# Patient Record
Sex: Male | Born: 1975 | Race: Black or African American | Hispanic: No | Marital: Single | Smoking: Current every day smoker
Health system: Southern US, Community
[De-identification: ages and names within clinical notes are randomized; demographics above are authoritative.]

## PROBLEM LIST (undated history)

## (undated) DIAGNOSIS — M1712 Unilateral primary osteoarthritis, left knee: Secondary | ICD-10-CM

## (undated) DIAGNOSIS — F419 Anxiety disorder, unspecified: Secondary | ICD-10-CM

## (undated) DIAGNOSIS — Z21 Asymptomatic human immunodeficiency virus [HIV] infection status: Secondary | ICD-10-CM

## (undated) DIAGNOSIS — F209 Schizophrenia, unspecified: Secondary | ICD-10-CM

## (undated) DIAGNOSIS — I10 Essential (primary) hypertension: Secondary | ICD-10-CM

## (undated) DIAGNOSIS — M545 Low back pain, unspecified: Secondary | ICD-10-CM

## (undated) DIAGNOSIS — I639 Cerebral infarction, unspecified: Secondary | ICD-10-CM

## (undated) DIAGNOSIS — F32A Depression, unspecified: Secondary | ICD-10-CM

## (undated) DIAGNOSIS — B20 Human immunodeficiency virus [HIV] disease: Secondary | ICD-10-CM

## (undated) DIAGNOSIS — M87052 Idiopathic aseptic necrosis of left femur: Secondary | ICD-10-CM

## (undated) DIAGNOSIS — F329 Major depressive disorder, single episode, unspecified: Secondary | ICD-10-CM

## (undated) DIAGNOSIS — F4310 Post-traumatic stress disorder, unspecified: Secondary | ICD-10-CM

## (undated) DIAGNOSIS — F319 Bipolar disorder, unspecified: Secondary | ICD-10-CM

## (undated) DIAGNOSIS — W3400XA Accidental discharge from unspecified firearms or gun, initial encounter: Secondary | ICD-10-CM

## (undated) DIAGNOSIS — B1920 Unspecified viral hepatitis C without hepatic coma: Secondary | ICD-10-CM

## (undated) DIAGNOSIS — F22 Delusional disorders: Secondary | ICD-10-CM

## (undated) DIAGNOSIS — G8929 Other chronic pain: Secondary | ICD-10-CM

## (undated) HISTORY — DX: Post-traumatic stress disorder, unspecified: F43.10

## (undated) HISTORY — PX: BACK SURGERY: SHX140

## (undated) HISTORY — DX: Cerebral infarction, unspecified: I63.9

## (undated) HISTORY — DX: Unspecified viral hepatitis C without hepatic coma: B19.20

---
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
## 2006-06-17 DIAGNOSIS — W3400XA Accidental discharge from unspecified firearms or gun, initial encounter: Secondary | ICD-10-CM

## 2006-06-17 HISTORY — PX: MOUTH SURGERY: SHX715

## 2006-06-17 HISTORY — DX: Accidental discharge from unspecified firearms or gun, initial encounter: W34.00XA

## 2011-12-02 DIAGNOSIS — M542 Cervicalgia: Secondary | ICD-10-CM | POA: Insufficient documentation

## 2011-12-02 DIAGNOSIS — G8929 Other chronic pain: Secondary | ICD-10-CM | POA: Insufficient documentation

## 2011-12-16 DIAGNOSIS — Z22322 Carrier or suspected carrier of Methicillin resistant Staphylococcus aureus: Secondary | ICD-10-CM | POA: Insufficient documentation

## 2011-12-30 DIAGNOSIS — F39 Unspecified mood [affective] disorder: Secondary | ICD-10-CM | POA: Insufficient documentation

## 2012-06-17 HISTORY — PX: SPINAL FUSION: SHX223

## 2013-05-28 DIAGNOSIS — M8700 Idiopathic aseptic necrosis of unspecified bone: Secondary | ICD-10-CM | POA: Insufficient documentation

## 2014-01-17 MED ORDER — IBUPROFEN 800 MG TAB
800 mg | ORAL_TABLET | Freq: Four times a day (QID) | ORAL | Status: DC | PRN
Start: 2014-01-17 — End: 2014-01-17

## 2014-01-17 MED ORDER — IBUPROFEN 800 MG TAB
800 mg | ORAL_TABLET | Freq: Four times a day (QID) | ORAL | Status: AC | PRN
Start: 2014-01-17 — End: 2014-01-24

## 2014-01-17 MED ORDER — HYDROCODONE-ACETAMINOPHEN 5 MG-325 MG TAB
5-325 mg | ORAL | Status: AC
Start: 2014-01-17 — End: 2014-01-17
  Administered 2014-01-17: 20:00:00 via ORAL

## 2014-01-17 MED FILL — HYDROCODONE-ACETAMINOPHEN 5 MG-325 MG TAB: 5-325 mg | ORAL | Qty: 1

## 2014-01-17 NOTE — ED Notes (Signed)
Pt to xray with xray tech.

## 2014-01-17 NOTE — ED Notes (Signed)
Pt medicated as ordered for pain. Liborio Nixon, tech to apply splint.
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
## 2014-01-17 NOTE — Other (Addendum)
I have reviewed discharge instructions with the patient.  The patient verbalized understanding of taking prescription for further pain and F/U with orthopedist. Pt declined discharge vital signs stating he needed to go.
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
## 2014-01-28 DIAGNOSIS — M79671 Pain in right foot: Secondary | ICD-10-CM

## 2014-01-28 DIAGNOSIS — G8929 Other chronic pain: Secondary | ICD-10-CM | POA: Insufficient documentation

## 2014-06-17 DIAGNOSIS — I639 Cerebral infarction, unspecified: Secondary | ICD-10-CM

## 2014-06-17 HISTORY — DX: Cerebral infarction, unspecified: I63.9

## 2014-06-17 HISTORY — PX: BRAIN SURGERY: SHX531

## 2015-09-14 DIAGNOSIS — K7460 Unspecified cirrhosis of liver: Secondary | ICD-10-CM | POA: Insufficient documentation

## 2016-03-14 ENCOUNTER — Emergency Department (HOSPITAL_COMMUNITY)
Admission: EM | Admit: 2016-03-14 | Discharge: 2016-03-14 | Disposition: A | Discharge status: Home / Self Care | Payer: Medicaid - Out of State | Attending: Dermatology | Admitting: Dermatology

## 2016-03-14 ENCOUNTER — Encounter (HOSPITAL_COMMUNITY): Payer: Self-pay | Admitting: Emergency Medicine

## 2016-03-14 DIAGNOSIS — Z760 Encounter for issue of repeat prescription: Secondary | ICD-10-CM | POA: Diagnosis not present

## 2016-03-14 DIAGNOSIS — Z5321 Procedure and treatment not carried out due to patient leaving prior to being seen by health care provider: Secondary | ICD-10-CM | POA: Diagnosis not present

## 2016-03-14 DIAGNOSIS — I10 Essential (primary) hypertension: Secondary | ICD-10-CM | POA: Insufficient documentation

## 2016-03-14 DIAGNOSIS — F17210 Nicotine dependence, cigarettes, uncomplicated: Secondary | ICD-10-CM | POA: Diagnosis not present

## 2016-03-14 HISTORY — DX: Essential (primary) hypertension: I10

## 2016-03-14 HISTORY — DX: Delusional disorders: F22

## 2016-03-14 HISTORY — DX: Asymptomatic human immunodeficiency virus (hiv) infection status: Z21

## 2016-03-14 HISTORY — DX: Human immunodeficiency virus (HIV) disease: B20

## 2016-03-14 HISTORY — DX: Anxiety disorder, unspecified: F41.9

## 2016-03-14 HISTORY — DX: Accidental discharge from unspecified firearms or gun, initial encounter: W34.00XA

## 2016-03-14 NOTE — ED Triage Notes (Signed)
Pt states just moved here from [place]. Takes methodone for narcotic withdrawls. Pt states he missed intake appt. Pt asking for methadone. States last dose yesterday. Denies SI/HI.

## 2016-03-14 NOTE — ED Notes (Signed)
Pt reports he just needs med refill and got to intake too late this morning for methadone program. His ride is currently here and he wishes to leave. He will return for intake tomm.

## 2016-03-25 DIAGNOSIS — F3181 Bipolar II disorder: Secondary | ICD-10-CM | POA: Insufficient documentation

## 2016-04-27 ENCOUNTER — Emergency Department (HOSPITAL_COMMUNITY)
Admission: EM | Admit: 2016-04-27 | Discharge: 2016-04-27 | Discharge status: Against Medical Advice | Payer: Medicaid - Out of State | Attending: Dermatology | Admitting: Dermatology

## 2016-04-27 ENCOUNTER — Encounter (HOSPITAL_COMMUNITY): Payer: Self-pay | Admitting: Nurse Practitioner

## 2016-04-27 DIAGNOSIS — I10 Essential (primary) hypertension: Secondary | ICD-10-CM | POA: Diagnosis not present

## 2016-04-27 DIAGNOSIS — F17210 Nicotine dependence, cigarettes, uncomplicated: Secondary | ICD-10-CM | POA: Diagnosis not present

## 2016-04-27 DIAGNOSIS — Z5321 Procedure and treatment not carried out due to patient leaving prior to being seen by health care provider: Secondary | ICD-10-CM | POA: Insufficient documentation

## 2016-04-27 DIAGNOSIS — R21 Rash and other nonspecific skin eruption: Secondary | ICD-10-CM | POA: Insufficient documentation

## 2016-04-27 NOTE — ED Triage Notes (Addendum)
Pt presents with c/o rash. The rash began 2 weeks ago. The rash is itchy, scaly, and red to entire body. He has tried hydrocortisone, triamcinolone, gold bond with no relief. The rash is worse at night. He has multiple reddened, scaly, dried patchy areas to trunk, arms, legs.

## 2016-04-27 NOTE — ED Notes (Addendum)
Pt in hallway speaking loudly at nursing staff to get doctor in room. Pt asked to please step into his room and I would inform MD. Pt continued cursing and did go back into room. [employer] at bedside to speak with pt. In arrival on [employer] pt states he is leaving ,MD beaton made aware pt left without signing any AMA forms.

## 2016-04-28 ENCOUNTER — Encounter (HOSPITAL_COMMUNITY): Payer: Self-pay

## 2016-04-28 ENCOUNTER — Emergency Department (HOSPITAL_COMMUNITY)
Admission: EM | Admit: 2016-04-28 | Discharge: 2016-04-28 | Disposition: A | Discharge status: Home / Self Care | Payer: Medicaid - Out of State | Attending: Emergency Medicine | Admitting: Emergency Medicine

## 2016-04-28 DIAGNOSIS — Z79899 Other long term (current) drug therapy: Secondary | ICD-10-CM | POA: Insufficient documentation

## 2016-04-28 DIAGNOSIS — B86 Scabies: Secondary | ICD-10-CM | POA: Insufficient documentation

## 2016-04-28 DIAGNOSIS — I10 Essential (primary) hypertension: Secondary | ICD-10-CM | POA: Insufficient documentation

## 2016-04-28 DIAGNOSIS — F17210 Nicotine dependence, cigarettes, uncomplicated: Secondary | ICD-10-CM | POA: Insufficient documentation

## 2016-04-28 MED ORDER — PREDNISONE 20 MG PO TABS: 60.0000 mg | ORAL_TABLET | Freq: Once | ORAL | 0 refills | Status: AC

## 2016-04-28 MED ORDER — PERMETHRIN 5 % EX CREA: TOPICAL_CREAM | CUTANEOUS | 1 refills | Status: DC

## 2016-04-28 NOTE — ED Notes (Signed)
Pt has raised, scaly rash on multiple parts of body.  Extreme itching. Denies SOB and swelling.   Patient states that he has been around neighbors that had been diagnosed with scabies.  Patient states that it is a large possibility that it is scabies.
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
## 2016-04-28 NOTE — ED Notes (Signed)
Unable to draw blood from patient, many burrows from scabies over veins that are scarred from IV drug use- pt states he hill return to have klabs drawn as needed when itching and scabies go away. Labs cancelled per dr pickering.

## 2016-04-28 NOTE — Discharge Instructions (Signed)
Apply the cream. It can be washed off in 8-14 hours later. Apply a second dose in 2 weeks.

## 2016-04-28 NOTE — ED Triage Notes (Signed)
Patient complains of severe itching and rash. The rash is scaly and open on feet. In ED yesterday but left prior to being seen. Has tried meds otc and prescription without relief

## 2016-05-14 ENCOUNTER — Emergency Department (HOSPITAL_COMMUNITY)
Admission: EM | Admit: 2016-05-14 | Discharge: 2016-05-14 | Disposition: A | Discharge status: Home / Self Care | Payer: Medicaid - Out of State | Attending: Emergency Medicine | Admitting: Emergency Medicine

## 2016-05-14 ENCOUNTER — Encounter (HOSPITAL_COMMUNITY): Payer: Self-pay | Admitting: *Deleted

## 2016-05-14 DIAGNOSIS — F17210 Nicotine dependence, cigarettes, uncomplicated: Secondary | ICD-10-CM | POA: Diagnosis not present

## 2016-05-14 DIAGNOSIS — I10 Essential (primary) hypertension: Secondary | ICD-10-CM | POA: Insufficient documentation

## 2016-05-14 DIAGNOSIS — R21 Rash and other nonspecific skin eruption: Secondary | ICD-10-CM | POA: Diagnosis present

## 2016-05-14 DIAGNOSIS — Z79899 Other long term (current) drug therapy: Secondary | ICD-10-CM | POA: Insufficient documentation

## 2016-05-14 MED ORDER — PERMETHRIN 5 % EX CREA: TOPICAL_CREAM | CUTANEOUS | 1 refills | Status: DC

## 2016-05-14 MED ORDER — PREDNISONE 10 MG PO TABS: ORAL_TABLET | ORAL | 0 refills | Status: DC

## 2016-05-14 MED FILL — PERMETHRIN 5% CREAM: 5 | 30 days supply | Qty: 120 | Fill #0

## 2016-05-14 MED FILL — predniSONE 10 MG TABS: 10 | 7 days supply | Qty: 21 | Fill #0

## 2016-05-14 NOTE — Discharge Planning (Signed)
[facility] consulted to regarding medication assistance.  Pt has out of [place] Medicaid and aware of steps to apply for [place] Medicare.  Pt advised to fill Rx at [facility] today and to visit DSS to apply for [place] Medicaid.  Pt verbalizes understanding.

## 2016-05-14 NOTE — ED Notes (Signed)
Care management in.

## 2016-05-14 NOTE — ED Notes (Signed)
Waiting for Care Management.

## 2016-05-14 NOTE — ED Triage Notes (Signed)
Pt reports being diagnosed with scabies on last visit, no relief with cream. Still has rash and itching to arms, torso and now neck.
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
## 2016-06-04 MED FILL — PERMETHRIN 5% CREAM: 5 | 30 days supply | Qty: 120 | Fill #1

## 2016-06-19 ENCOUNTER — Encounter (HOSPITAL_COMMUNITY): Payer: Self-pay | Admitting: Emergency Medicine

## 2016-06-19 ENCOUNTER — Emergency Department (HOSPITAL_COMMUNITY)
Admission: EM | Admit: 2016-06-19 | Discharge: 2016-06-19 | Disposition: A | Discharge status: Home / Self Care | Payer: Medicaid - Out of State | Attending: Physician Assistant | Admitting: Physician Assistant

## 2016-06-19 DIAGNOSIS — I10 Essential (primary) hypertension: Secondary | ICD-10-CM | POA: Diagnosis not present

## 2016-06-19 DIAGNOSIS — F17210 Nicotine dependence, cigarettes, uncomplicated: Secondary | ICD-10-CM | POA: Diagnosis not present

## 2016-06-19 DIAGNOSIS — L309 Dermatitis, unspecified: Secondary | ICD-10-CM | POA: Diagnosis not present

## 2016-06-19 DIAGNOSIS — R21 Rash and other nonspecific skin eruption: Secondary | ICD-10-CM | POA: Diagnosis present

## 2016-06-19 MED ORDER — NYSTATIN-TRIAMCINOLONE 100000-0.1 UNIT/GM-% EX CREA: TOPICAL_CREAM | CUTANEOUS | 0 refills | Status: DC

## 2016-06-19 MED ORDER — PREDNISONE 50 MG PO TABS: ORAL_TABLET | ORAL | 0 refills | Status: DC

## 2016-06-19 MED ORDER — PREDNISONE 20 MG PO TABS
60.0000 mg | ORAL_TABLET | Freq: Once | ORAL | Status: AC
  Administered 2016-06-19: 60 mg via ORAL
  Filled 2016-06-19: qty 3

## 2016-06-19 NOTE — Discharge Instructions (Signed)
Call Dr. Juel Burrow office for a follow up appointment.

## 2016-06-19 NOTE — ED Triage Notes (Signed)
Pt presents to ED for a "re-occurrence" of scabies.  Pt recently treated and moved out of hotel he was staying in.  States they washed everything in bleach.  Rash with scarring noted to bilateral arms, pt c/o itching to legs, torso and back as well.

## 2016-06-19 NOTE — ED Notes (Signed)
..  Video visit coupon no. [id number] mced  and handout given to pt. on discharge .
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
## 2016-09-02 ENCOUNTER — Ambulatory Visit (INDEPENDENT_AMBULATORY_CARE_PROVIDER_SITE_OTHER): Payer: Medicaid Other | Admitting: Physician Assistant

## 2016-09-02 ENCOUNTER — Encounter (INDEPENDENT_AMBULATORY_CARE_PROVIDER_SITE_OTHER): Payer: Self-pay | Admitting: Physician Assistant

## 2016-09-02 VITALS — BP 162/102 | HR 96 | Temp 97.8°F | Ht 69.5 in | Wt 165.2 lb

## 2016-09-02 DIAGNOSIS — K759 Inflammatory liver disease, unspecified: Secondary | ICD-10-CM

## 2016-09-02 DIAGNOSIS — M25562 Pain in left knee: Secondary | ICD-10-CM

## 2016-09-02 DIAGNOSIS — F209 Schizophrenia, unspecified: Secondary | ICD-10-CM | POA: Diagnosis not present

## 2016-09-02 DIAGNOSIS — F4310 Post-traumatic stress disorder, unspecified: Secondary | ICD-10-CM | POA: Diagnosis not present

## 2016-09-02 DIAGNOSIS — M25561 Pain in right knee: Secondary | ICD-10-CM

## 2016-09-02 DIAGNOSIS — F419 Anxiety disorder, unspecified: Secondary | ICD-10-CM

## 2016-09-02 DIAGNOSIS — M25462 Effusion, left knee: Secondary | ICD-10-CM | POA: Insufficient documentation

## 2016-09-02 DIAGNOSIS — I10 Essential (primary) hypertension: Secondary | ICD-10-CM | POA: Insufficient documentation

## 2016-09-02 DIAGNOSIS — G8929 Other chronic pain: Secondary | ICD-10-CM

## 2016-09-02 DIAGNOSIS — B20 Human immunodeficiency virus [HIV] disease: Secondary | ICD-10-CM | POA: Insufficient documentation

## 2016-09-02 DIAGNOSIS — F200 Paranoid schizophrenia: Secondary | ICD-10-CM | POA: Insufficient documentation

## 2016-09-02 DIAGNOSIS — K739 Chronic hepatitis, unspecified: Secondary | ICD-10-CM | POA: Insufficient documentation

## 2016-09-02 DIAGNOSIS — L209 Atopic dermatitis, unspecified: Secondary | ICD-10-CM

## 2016-09-02 MED ORDER — LISINOPRIL 40 MG PO TABS: 40.0000 mg | ORAL_TABLET | Freq: Every day | ORAL | 11 refills | Status: DC

## 2016-09-02 MED ORDER — CITALOPRAM HYDROBROMIDE 10 MG PO TABS: 20.0000 mg | ORAL_TABLET | Freq: Every day | ORAL | 2 refills | Status: DC

## 2016-09-02 MED ORDER — CHLORTHALIDONE 50 MG PO TABS: 50.0000 mg | ORAL_TABLET | Freq: Every day | ORAL | 11 refills | Status: DC

## 2016-09-02 MED ORDER — ELVITEG-COBIC-EMTRICIT-TENOFAF 150-150-200-10 MG PO TABS: 1.0000 | ORAL_TABLET | Freq: Every day | ORAL | 2 refills | Status: DC

## 2016-09-02 MED ORDER — GABAPENTIN 300 MG PO CAPS: 300.0000 mg | ORAL_CAPSULE | Freq: Three times a day (TID) | ORAL | 2 refills | Status: DC

## 2016-09-02 MED ORDER — TRIAMCINOLONE ACETONIDE 0.1 % EX OINT: 1.0000 "application " | TOPICAL_OINTMENT | Freq: Two times a day (BID) | CUTANEOUS | 0 refills | Status: DC

## 2016-09-02 MED ORDER — QUETIAPINE FUMARATE 50 MG PO TABS: 50.0000 mg | ORAL_TABLET | Freq: Every day | ORAL | 2 refills | Status: DC
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
## 2016-09-02 NOTE — Patient Instructions (Addendum)

## 2016-09-27 ENCOUNTER — Ambulatory Visit (INDEPENDENT_AMBULATORY_CARE_PROVIDER_SITE_OTHER): Payer: Medicaid Other | Admitting: Physician Assistant

## 2016-12-09 DIAGNOSIS — B1920 Unspecified viral hepatitis C without hepatic coma: Secondary | ICD-10-CM | POA: Insufficient documentation

## 2016-12-09 DIAGNOSIS — Z79891 Long term (current) use of opiate analgesic: Secondary | ICD-10-CM | POA: Insufficient documentation

## 2016-12-09 DIAGNOSIS — F1190 Opioid use, unspecified, uncomplicated: Secondary | ICD-10-CM | POA: Insufficient documentation

## 2016-12-09 DIAGNOSIS — F1120 Opioid dependence, uncomplicated: Secondary | ICD-10-CM | POA: Insufficient documentation

## 2016-12-09 DIAGNOSIS — G894 Chronic pain syndrome: Secondary | ICD-10-CM | POA: Insufficient documentation

## 2016-12-09 NOTE — Progress Notes (Signed)
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 future appointments.  Primary Care Physician: Jesse Demark, PA-C Location: [facility] Note by: Jesse Munoz. Jesse Munoz, M.D, DABA, DABAPM, DABPM, DABIPP, FIPP Date: 12/10/2016; Time: 1:20 PM  There are no Patient Instructions on file for this visit.

## 2016-12-10 ENCOUNTER — Encounter: Payer: Self-pay | Admitting: Pain Medicine

## 2016-12-10 ENCOUNTER — Ambulatory Visit: Payer: Medicaid Other | Attending: Pain Medicine | Admitting: Pain Medicine

## 2016-12-10 VITALS — BP 163/100 | HR 103 | Temp 98.6°F | Resp 16 | Ht 69.0 in | Wt 174.0 lb

## 2016-12-10 DIAGNOSIS — E079 Disorder of thyroid, unspecified: Secondary | ICD-10-CM | POA: Insufficient documentation

## 2016-12-10 DIAGNOSIS — F17210 Nicotine dependence, cigarettes, uncomplicated: Secondary | ICD-10-CM | POA: Diagnosis not present

## 2016-12-10 DIAGNOSIS — Z880 Allergy status to penicillin: Secondary | ICD-10-CM | POA: Insufficient documentation

## 2016-12-10 DIAGNOSIS — F1120 Opioid dependence, uncomplicated: Secondary | ICD-10-CM | POA: Diagnosis not present

## 2016-12-10 DIAGNOSIS — F3181 Bipolar II disorder: Secondary | ICD-10-CM

## 2016-12-10 DIAGNOSIS — I129 Hypertensive chronic kidney disease with stage 1 through stage 4 chronic kidney disease, or unspecified chronic kidney disease: Secondary | ICD-10-CM | POA: Insufficient documentation

## 2016-12-10 DIAGNOSIS — M25571 Pain in right ankle and joints of right foot: Secondary | ICD-10-CM

## 2016-12-10 DIAGNOSIS — Z9889 Other specified postprocedural states: Secondary | ICD-10-CM | POA: Insufficient documentation

## 2016-12-10 DIAGNOSIS — Z79899 Other long term (current) drug therapy: Secondary | ICD-10-CM | POA: Insufficient documentation

## 2016-12-10 DIAGNOSIS — Z22322 Carrier or suspected carrier of Methicillin resistant Staphylococcus aureus: Secondary | ICD-10-CM | POA: Diagnosis not present

## 2016-12-10 DIAGNOSIS — F1190 Opioid use, unspecified, uncomplicated: Secondary | ICD-10-CM | POA: Diagnosis not present

## 2016-12-10 DIAGNOSIS — Z79891 Long term (current) use of opiate analgesic: Secondary | ICD-10-CM

## 2016-12-10 DIAGNOSIS — N189 Chronic kidney disease, unspecified: Secondary | ICD-10-CM | POA: Diagnosis not present

## 2016-12-10 DIAGNOSIS — K739 Chronic hepatitis, unspecified: Secondary | ICD-10-CM | POA: Diagnosis not present

## 2016-12-10 DIAGNOSIS — Z21 Asymptomatic human immunodeficiency virus [HIV] infection status: Secondary | ICD-10-CM

## 2016-12-10 DIAGNOSIS — C220 Liver cell carcinoma: Secondary | ICD-10-CM | POA: Insufficient documentation

## 2016-12-10 DIAGNOSIS — Z87442 Personal history of urinary calculi: Secondary | ICD-10-CM | POA: Diagnosis not present

## 2016-12-10 DIAGNOSIS — M79671 Pain in right foot: Secondary | ICD-10-CM | POA: Insufficient documentation

## 2016-12-10 DIAGNOSIS — F1490 Cocaine use, unspecified, uncomplicated: Secondary | ICD-10-CM

## 2016-12-10 DIAGNOSIS — M25572 Pain in left ankle and joints of left foot: Secondary | ICD-10-CM | POA: Diagnosis not present

## 2016-12-10 DIAGNOSIS — G894 Chronic pain syndrome: Secondary | ICD-10-CM | POA: Diagnosis present

## 2016-12-10 DIAGNOSIS — M25561 Pain in right knee: Secondary | ICD-10-CM

## 2016-12-10 DIAGNOSIS — M25562 Pain in left knee: Secondary | ICD-10-CM

## 2016-12-10 DIAGNOSIS — Z981 Arthrodesis status: Secondary | ICD-10-CM | POA: Diagnosis not present

## 2016-12-10 DIAGNOSIS — F203 Undifferentiated schizophrenia: Secondary | ICD-10-CM

## 2016-12-10 DIAGNOSIS — K7460 Unspecified cirrhosis of liver: Secondary | ICD-10-CM | POA: Diagnosis not present

## 2016-12-10 DIAGNOSIS — Z87828 Personal history of other (healed) physical injury and trauma: Secondary | ICD-10-CM

## 2016-12-10 DIAGNOSIS — G8929 Other chronic pain: Secondary | ICD-10-CM

## 2016-12-10 NOTE — Progress Notes (Signed)
Safety precautions to be maintained throughout the outpatient stay will include: orient to surroundings, keep bed in low position, maintain call bell within reach at all times, provide assistance with transfer out of bed and ambulation.  

## 2016-12-11 DIAGNOSIS — F1490 Cocaine use, unspecified, uncomplicated: Secondary | ICD-10-CM | POA: Insufficient documentation

## 2016-12-11 DIAGNOSIS — Z87828 Personal history of other (healed) physical injury and trauma: Secondary | ICD-10-CM | POA: Insufficient documentation

## 2016-12-14 LAB — COMPLIANCE DRUG ANALYSIS, UR

## 2016-12-17 ENCOUNTER — Other Ambulatory Visit: Payer: Self-pay

## 2016-12-17 ENCOUNTER — Other Ambulatory Visit: Payer: Self-pay | Admitting: Pain Medicine

## 2016-12-17 DIAGNOSIS — F1490 Cocaine use, unspecified, uncomplicated: Secondary | ICD-10-CM

## 2016-12-17 DIAGNOSIS — K739 Chronic hepatitis, unspecified: Secondary | ICD-10-CM

## 2016-12-17 DIAGNOSIS — F1120 Opioid dependence, uncomplicated: Secondary | ICD-10-CM

## 2016-12-17 DIAGNOSIS — K7460 Unspecified cirrhosis of liver: Secondary | ICD-10-CM

## 2016-12-17 DIAGNOSIS — G894 Chronic pain syndrome: Secondary | ICD-10-CM

## 2016-12-17 DIAGNOSIS — B1920 Unspecified viral hepatitis C without hepatic coma: Secondary | ICD-10-CM

## 2016-12-17 DIAGNOSIS — B20 Human immunodeficiency virus [HIV] disease: Secondary | ICD-10-CM

## 2016-12-17 DIAGNOSIS — Z21 Asymptomatic human immunodeficiency virus [HIV] infection status: Secondary | ICD-10-CM

## 2016-12-17 DIAGNOSIS — Z22322 Carrier or suspected carrier of Methicillin resistant Staphylococcus aureus: Secondary | ICD-10-CM

## 2016-12-17 DIAGNOSIS — F1190 Opioid use, unspecified, uncomplicated: Secondary | ICD-10-CM

## 2016-12-17 DIAGNOSIS — Z79891 Long term (current) use of opiate analgesic: Secondary | ICD-10-CM

## 2016-12-21 LAB — CBC WITH DIFFERENTIAL/PLATELET
Basophils Absolute: 0 10*3/uL (ref 0.0–0.2)
Basos: 0 %
EOS (ABSOLUTE): 0.1 10*3/uL (ref 0.0–0.4)
Eos: 2 %
HEMATOCRIT: 42.3 % (ref 37.5–51.0)
Hemoglobin: 14 g/dL (ref 13.0–17.7)
IMMATURE GRANS (ABS): 0 10*3/uL (ref 0.0–0.1)
IMMATURE GRANULOCYTES: 0 %
LYMPHS: 47 %
Lymphocytes Absolute: 4.2 10*3/uL — ABNORMAL HIGH (ref 0.7–3.1)
MCH: 27.9 pg (ref 26.6–33.0)
MCHC: 33.1 g/dL (ref 31.5–35.7)
MCV: 84 fL (ref 79–97)
MONOS ABS: 0.7 10*3/uL (ref 0.1–0.9)
Monocytes: 7 %
NEUTROS PCT: 44 %
Neutrophils Absolute: 4 10*3/uL (ref 1.4–7.0)
PLATELETS: 179 10*3/uL (ref 150–379)
RBC: 5.02 x10E6/uL (ref 4.14–5.80)
RDW: 15.2 % (ref 12.3–15.4)
WBC: 9 10*3/uL (ref 3.4–10.8)

## 2016-12-21 LAB — SEDIMENTATION RATE: SED RATE: 37 mm/h — AB (ref 0–15)

## 2016-12-21 LAB — C-REACTIVE PROTEIN: CRP: 3.3 mg/L (ref 0.0–4.9)

## 2016-12-21 LAB — 25-HYDROXY VITAMIN D LCMS D2+D3: 25-Hydroxy, Vitamin D: 29 ng/mL — ABNORMAL LOW

## 2016-12-21 LAB — 25-HYDROXYVITAMIN D LCMS D2+D3: 25-HYDROXY, VITAMIN D-3: 29 ng/mL

## 2016-12-21 LAB — VITAMIN B12: Vitamin B-12: 699 pg/mL (ref 232–1245)

## 2016-12-21 LAB — MAGNESIUM: MAGNESIUM: 1.8 mg/dL (ref 1.6–2.3)

## 2017-01-03 ENCOUNTER — Ambulatory Visit
Admission: RE | Admit: 2017-01-03 | Discharge: 2017-01-03 | Disposition: A | Discharge status: Home / Self Care | Payer: Medicaid Other | Source: Ambulatory Visit | Attending: Physician Assistant | Admitting: Physician Assistant

## 2017-01-03 ENCOUNTER — Ambulatory Visit
Admission: RE | Admit: 2017-01-03 | Discharge: 2017-01-03 | Disposition: A | Discharge status: Home / Self Care | Payer: Medicaid Other | Source: Ambulatory Visit | Attending: Pain Medicine | Admitting: Pain Medicine

## 2017-01-03 DIAGNOSIS — G8929 Other chronic pain: Secondary | ICD-10-CM

## 2017-01-03 DIAGNOSIS — Z981 Arthrodesis status: Secondary | ICD-10-CM | POA: Insufficient documentation

## 2017-01-03 DIAGNOSIS — M25571 Pain in right ankle and joints of right foot: Secondary | ICD-10-CM

## 2017-01-03 DIAGNOSIS — M25561 Pain in right knee: Principal | ICD-10-CM

## 2017-01-03 DIAGNOSIS — M25562 Pain in left knee: Principal | ICD-10-CM

## 2017-01-03 DIAGNOSIS — M1711 Unilateral primary osteoarthritis, right knee: Secondary | ICD-10-CM | POA: Insufficient documentation

## 2017-01-03 DIAGNOSIS — Q898 Other specified congenital malformations: Secondary | ICD-10-CM | POA: Diagnosis not present

## 2017-01-15 ENCOUNTER — Other Ambulatory Visit: Payer: Self-pay

## 2017-01-23 ENCOUNTER — Telehealth (HOSPITAL_COMMUNITY): Payer: Self-pay

## 2017-01-29 ENCOUNTER — Ambulatory Visit: Payer: Medicaid Other | Admitting: Pain Medicine

## 2017-02-06 ENCOUNTER — Ambulatory Visit: Payer: Medicaid Other | Attending: Pain Medicine | Admitting: Pain Medicine

## 2017-02-06 ENCOUNTER — Encounter: Payer: Self-pay | Admitting: Pain Medicine

## 2017-02-06 VITALS — BP 142/87 | HR 96 | Temp 98.4°F | Resp 16 | Ht 69.2 in | Wt 190.0 lb

## 2017-02-06 DIAGNOSIS — I10 Essential (primary) hypertension: Secondary | ICD-10-CM | POA: Insufficient documentation

## 2017-02-06 DIAGNOSIS — F4310 Post-traumatic stress disorder, unspecified: Secondary | ICD-10-CM | POA: Insufficient documentation

## 2017-02-06 DIAGNOSIS — K7460 Unspecified cirrhosis of liver: Secondary | ICD-10-CM | POA: Insufficient documentation

## 2017-02-06 DIAGNOSIS — Z880 Allergy status to penicillin: Secondary | ICD-10-CM | POA: Insufficient documentation

## 2017-02-06 DIAGNOSIS — M25561 Pain in right knee: Secondary | ICD-10-CM

## 2017-02-06 DIAGNOSIS — F1190 Opioid use, unspecified, uncomplicated: Secondary | ICD-10-CM

## 2017-02-06 DIAGNOSIS — Z21 Asymptomatic human immunodeficiency virus [HIV] infection status: Secondary | ICD-10-CM | POA: Insufficient documentation

## 2017-02-06 DIAGNOSIS — G8929 Other chronic pain: Secondary | ICD-10-CM | POA: Diagnosis not present

## 2017-02-06 DIAGNOSIS — M8700 Idiopathic aseptic necrosis of unspecified bone: Secondary | ICD-10-CM | POA: Diagnosis not present

## 2017-02-06 DIAGNOSIS — G894 Chronic pain syndrome: Secondary | ICD-10-CM | POA: Insufficient documentation

## 2017-02-06 DIAGNOSIS — F1490 Cocaine use, unspecified, uncomplicated: Secondary | ICD-10-CM

## 2017-02-06 DIAGNOSIS — B182 Chronic viral hepatitis C: Secondary | ICD-10-CM

## 2017-02-06 DIAGNOSIS — B20 Human immunodeficiency virus [HIV] disease: Secondary | ICD-10-CM | POA: Diagnosis not present

## 2017-02-06 DIAGNOSIS — M25562 Pain in left knee: Secondary | ICD-10-CM | POA: Diagnosis present

## 2017-02-06 DIAGNOSIS — F3181 Bipolar II disorder: Secondary | ICD-10-CM | POA: Diagnosis not present

## 2017-02-06 DIAGNOSIS — F1120 Opioid dependence, uncomplicated: Secondary | ICD-10-CM

## 2017-02-06 DIAGNOSIS — F1911 Other psychoactive substance abuse, in remission: Secondary | ICD-10-CM | POA: Diagnosis not present

## 2017-02-06 DIAGNOSIS — Z87898 Personal history of other specified conditions: Secondary | ICD-10-CM

## 2017-02-06 DIAGNOSIS — R531 Weakness: Secondary | ICD-10-CM | POA: Diagnosis not present

## 2017-02-06 DIAGNOSIS — M542 Cervicalgia: Secondary | ICD-10-CM | POA: Insufficient documentation

## 2017-02-06 DIAGNOSIS — M170 Bilateral primary osteoarthritis of knee: Secondary | ICD-10-CM | POA: Insufficient documentation

## 2017-02-06 DIAGNOSIS — M25571 Pain in right ankle and joints of right foot: Secondary | ICD-10-CM | POA: Diagnosis not present

## 2017-02-06 MED ORDER — OXYCODONE HCL 10 MG PO TABS
10.0000 mg | ORAL_TABLET | Freq: Four times a day (QID) | ORAL | 0 refills | Status: DC | PRN
Start: 2017-02-06 — End: 2017-03-05

## 2017-02-06 NOTE — Patient Instructions (Addendum)
Pt instructed to get labwork done today.  Lab worker at clinic has left for today, instructed [place] medical mall to get labwork done.  ____________________________________________________________________________________________  Medication Rules  Applies to: All patients receiving prescriptions (written or electronic).  Pharmacy of record: Pharmacy where electronic prescriptions will be sent. If written prescriptions are taken to a different pharmacy, please inform the nursing staff. The pharmacy listed in the electronic medical record should be the one where you would like electronic prescriptions to be sent.  Prescription refills: Only during scheduled appointments. Applies to both, written and electronic prescriptions.  NOTE: The following applies primarily to controlled substances (Opioid* Pain Medications).   Patient's responsibilities: 1. Pain Pills: Bring all pain pills to every appointment (except for procedure appointments). 2. Pill Bottles: Bring pills in original pharmacy bottle. Always bring newest bottle. Bring bottle, even if empty. 3. Medication refills: You are responsible for knowing and keeping track of what medications you need refilled. The day before your appointment, write a list of all prescriptions that need to be refilled. Bring that list to your appointment and give it to the admitting nurse. Prescriptions will be written only during appointments. If you forget a medication, it will not be "Called in", "Faxed", or "electronically sent". You will need to get another appointment to get these prescribed. 4. Prescription Accuracy: You are responsible for carefully inspecting your prescriptions before leaving our office. Have the discharge nurse carefully go over each prescription with you, before taking them home. Make sure that your name is accurately spelled, that your address is correct. Check the name and dose of your medication to make sure it is accurate. Check the  number of pills, and the written instructions to make sure they are clear and accurate. Make sure that you are given enough medication to last until your next medication refill appointment. 5. Taking Medication: Take medication as prescribed. Never take more pills than instructed. Never take medication more frequently than prescribed. Taking less pills or less frequently is permitted and encouraged, when it comes to controlled substances (written prescriptions).  6. Inform other Doctors: Always inform, all of your healthcare providers, of all the medications you take. 7. Pain Medication from other Providers: You are not allowed to accept any additional pain medication from any other Doctor or Healthcare provider. There are two exceptions to this rule. (see below) In the event that you require additional pain medication, you are responsible for notifying us, as stated below. 8. Medication Agreement: You are responsible for carefully reading and following our Medication Agreement. This must be signed before receiving any prescriptions from our practice. Safely store a copy of your signed Agreement. Violations to the Agreement will result in no further prescriptions. (Additional copies of our Medication Agreement are available upon request.) 9. Laws, Rules, & Regulations: All patients are expected to follow all Federal and [employer], [employer], Rules, [employer]. Ignorance of the Laws does not constitute a valid excuse. The use of any illegal substances is prohibited. 10. Adopted CDC guidelines & recommendations: Target dosing levels will be at or below 60 MME/day. Use of benzodiazepines** is not recommended.  Exceptions: There are only two exceptions to the rule of not receiving pain medications from other Healthcare Providers. 1. Exception #1 (Emergencies): In the event of an emergency (i.e.: accident requiring emergency care), you are allowed to receive additional pain medication. However, you are  responsible for: As soon as you are able, call our office [phone], at any time  of the day or night, and leave a message stating your name, the date and nature of the emergency, and the name and dose of the medication prescribed. In the event that your call is answered by a member of our staff, make sure to document and save the date, time, and the name of the person that took your information.  2. Exception #2 (Planned Surgery): In the event that you are scheduled by another doctor or dentist to have any type of surgery or procedure, you are allowed (for a period no longer than 30 days), to receive additional pain medication, for the acute post-op pain. However, in this case, you are responsible for picking up a copy of our "Post-op Pain Management for Surgeons" handout, and giving it to your surgeon or dentist. This document is available at our office, and does not require an appointment to obtain it. Simply go to our office during business hours (Monday-Thursday from 8:00 AM to 4:00 PM) (Friday 8:00 AM to 12:00 Noon) or if you have a scheduled appointment with [place], prior to your surgery, and ask for it by name. In addition, you will need to provide [place] with your name, name of your surgeon, type of surgery, and date of procedure or surgery.  *Opioid medications include: morphine, codeine, oxycodone, oxymorphone, hydrocodone, hydromorphone, meperidine, tramadol, tapentadol, buprenorphine, fentanyl, methadone. **Benzodiazepine medications include: diazepam (Valium), alprazolam (Xanax), clonazepam (Klonopine), lorazepam (Ativan), clorazepate (Tranxene), chlordiazepoxide (Librium), estazolam (Prosom), oxazepam (Serax), temazepam (Restoril), triazolam (Halcion)  ____________________________________________________________________________________________ ____________________________________________________________________________________________  Pain Scale  Introduction: The pain score used by this  practice is the Verbal Numerical Rating Scale (VNRS-11). This is an 11-point scale. It is for adults and children 10 years or older. There are significant differences in how the pain score is reported, used, and applied. Forget everything you learned in the past and learn this scoring system.  General Information: The scale should reflect your current level of pain. Unless you are specifically asked for the level of your worst pain, or your average pain. If you are asked for one of these two, then it should be understood that it is over the past 24 hours.  Basic Activities of Daily Living (ADL): Personal hygiene, dressing, eating, transferring, and using restroom.  Instructions: Most patients tend to report their level of pain as a combination of two factors, their physical pain and their psychosocial pain. This last one is also known as "suffering" and it is reflection of how physical pain affects you socially and psychologically. From now on, report them separately. From this point on, when asked to report your pain level, report only your physical pain. Use the following table for reference.  Pain Clinic Pain Levels (0-5/10)  Pain Level Score  Description  No Pain 0   Mild pain 1 Nagging, annoying, but does not interfere with basic activities of daily living (ADL). Patients are able to eat, bathe, get dressed, toileting (being able to get on and off the toilet and perform personal hygiene functions), transfer (move in and out of bed or a chair without assistance), and maintain continence (able to control bladder and bowel functions). Blood pressure and heart rate are unaffected. A normal heart rate for a healthy adult ranges from 60 to 100 bpm (beats per minute).   Mild to moderate pain 2 Noticeable and distracting. Impossible to hide from other people. More frequent flare-ups. Still possible to adapt and function close to normal. It can be very annoying and may have occasional stronger flare-ups.  With discipline, patients may get used to it and adapt.   Moderate pain 3 Interferes significantly with activities of daily living (ADL). It becomes difficult to feed, bathe, get dressed, get on and off the toilet or to perform personal hygiene functions. Difficult to get in and out of bed or a chair without assistance. Very distracting. With effort, it can be ignored when deeply involved in activities.   Moderately severe pain 4 Impossible to ignore for more than a few minutes. With effort, patients may still be able to manage work or participate in some social activities. Very difficult to concentrate. Signs of autonomic nervous system discharge are evident: dilated pupils (mydriasis); mild sweating (diaphoresis); sleep interference. Heart rate becomes elevated (>115 bpm). Diastolic blood pressure (lower number) rises above 100 mmHg. Patients find relief in laying down and not moving.   Severe pain 5 Intense and extremely unpleasant. Associated with frowning face and frequent crying. Pain overwhelms the senses.  Ability to do any activity or maintain social relationships becomes significantly limited. Conversation becomes difficult. Pacing back and forth is common, as getting into a comfortable position is nearly impossible. Pain wakes you up from deep sleep. Physical signs will be obvious: pupillary dilation; increased sweating; goosebumps; brisk reflexes; cold, clammy hands and feet; nausea, vomiting or dry heaves; loss of appetite; significant sleep disturbance with inability to fall asleep or to remain asleep. When persistent, significant weight loss is observed due to the complete loss of appetite and sleep deprivation.  Blood pressure and heart rate becomes significantly elevated. Caution: If elevated blood pressure triggers a pounding headache associated with blurred vision, then the patient should immediately seek attention at an urgent or emergency care unit, as these may be signs of an impending  stroke.    Emergency Department Pain Levels (6-10/10)  Emergency Room Pain 6 Severely limiting. Requires emergency care and should not be seen or managed at an outpatient pain management facility. Communication becomes difficult and requires great effort. Assistance to reach the emergency department may be required. Facial flushing and profuse sweating along with potentially dangerous increases in heart rate and blood pressure will be evident.   Distressing pain 7 Self-care is very difficult. Assistance is required to transport, or use restroom. Assistance to reach the emergency department will be required. Tasks requiring coordination, such as bathing and getting dressed become very difficult.   Disabling pain 8 Self-care is no longer possible. At this level, pain is disabling. The individual is unable to do even the most "basic" activities such as walking, eating, bathing, dressing, transferring to a bed, or toileting. Fine motor skills are lost. It is difficult to think clearly.   Incapacitating pain 9 Pain becomes incapacitating. Thought processing is no longer possible. Difficult to remember your own name. Control of movement and coordination are lost.   The worst pain imaginable 10 At this level, most patients pass out from pain. When this level is reached, collapse of the autonomic nervous system occurs, leading to a sudden drop in blood pressure and heart rate. This in turn results in a temporary and dramatic drop in blood flow to the brain, leading to a loss of consciousness. Fainting is one of the body's self defense mechanisms. Passing out puts the brain in a calmed state and causes it to shut down for a while, in order to begin the healing process.    Summary: 1. Refer to this scale when providing [place] with your pain level. 2. Be accurate and careful when  reporting your pain level. This will help with your care. 3. Over-reporting your pain level will lead to loss of credibility. 4. Even  a level of 1/10 means that there is pain and will be treated at our facility. 5. High, inaccurate reporting will be documented as "Symptom Exaggeration", leading to loss of credibility and suspicions of possible secondary gains such as obtaining more narcotics, or wanting to appear disabled, for fraudulent reasons. 6. Only pain levels of 5 or below will be seen at our facility. 7. Pain levels of 6 and above will be sent to the Emergency Department and the appointment cancelled. ____________________________________________________________________________________________  Post-op Pain Management on a Chronic Pain Patient  Why should the surgeon manage his patient's post-op pain? The Surgeon is uniquely qualified to determine the amount of post-operative pain to be expected on a procedure or surgery that he/she has performed. Even with similar surgeries, the surgeon's perspective on expected pain is unique, since he/she performed the procedure and knows the degree of difficulty and/or tissue damage involved in each particular case. The surgeon is also up to date on events such as blood loss, intraoperative complications, and PO (per orum) status that may influence not only the patient's dose and schedule, but route of administration as well.  How about telling chronic pain patients to just double up or increase their usual pain medication intake to compensate for the increased pain? This is a bad idea since it will lead to the patient running out of his/her usual medications early and this may create a problem at the level of the insurance, which supplies medications based on the amount and schedule stated on the prescription. Running out early may trigger an event where the refill is denied by the insurance company and/or pharmacy. In addition, this practice provides a very poor paper trail as to why this patient ran out of medication early. In addition, from the perspective of the pain physician, it  creates a nightmare in the accounting of the patient's medication.  So, what should I do as a Psychologist, sport and exercise when confronted with a patient that needs surgery and already takes a significant amount of pain medicine for their chronic pain, which may or may not be related to the surgery I have to perform?  This is what the surgeon should do: 1. Do not change the dose or schedule of the pain medications prescribed by the pain specialist. This medication regimen allows for the patient's chronic pain to be under control, so as to bring that patient down to the level of an average individual. 2. Have the patient continue their usual pain management regimen, without any alterations. In addition, manage the post-operative pain as you would on any other "narcotic naive" patient. Do not attempt to compensate for tolerance. This is what the patient's usual regimen will do for you. Simply treat the patient as if they had no chronic pain and as if they were taking no other pain medications. 3. Talk to the patient about the medication, just like you would for anyone else. Do not assume that they are experts in opioids. Make sure you let the patient know that the medication is to be used only if absolutely necessary. (PRN) 4. Prescribe the medication for as long as you would on any other patient undergoing the same type of surgery. Prescribe for the same average amount of time that you would on any other patient. Avoid prescribing for longer periods. 5. Send [place] a copy of the operative report with  information about your choice of the post-op pain medication provided. 6. Keep [place] informed of any complications that may prolong the average duration patient's post-op pain.  If you have any questions, please feel to contact us at [phone]. _____________________________________________________________________________________________
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
## 2017-02-24 ENCOUNTER — Ambulatory Visit: Payer: Medicaid Other | Admitting: Infectious Disease

## 2017-02-26 NOTE — Progress Notes (Signed)
Results were reviewed and found to be: abnormal  No acute injury or pathology identified  Review would suggest interventional pain management techniques may be of benefit

## 2017-02-26 NOTE — Progress Notes (Signed)
Results were reviewed and found to be: abnormal  Further testing may be useful

## 2017-03-04 ENCOUNTER — Ambulatory Visit: Payer: Medicaid Other | Admitting: Pain Medicine

## 2017-03-05 ENCOUNTER — Encounter: Payer: Self-pay | Admitting: Pain Medicine

## 2017-03-05 ENCOUNTER — Ambulatory Visit: Payer: Medicaid Other | Admitting: Infectious Disease

## 2017-03-05 ENCOUNTER — Ambulatory Visit: Payer: Medicaid Other | Attending: Pain Medicine | Admitting: Pain Medicine

## 2017-03-05 VITALS — BP 147/101 | HR 93 | Temp 98.1°F | Resp 16 | Ht 69.0 in | Wt 191.0 lb

## 2017-03-05 DIAGNOSIS — F1490 Cocaine use, unspecified, uncomplicated: Secondary | ICD-10-CM | POA: Insufficient documentation

## 2017-03-05 DIAGNOSIS — L209 Atopic dermatitis, unspecified: Secondary | ICD-10-CM | POA: Insufficient documentation

## 2017-03-05 DIAGNOSIS — G894 Chronic pain syndrome: Secondary | ICD-10-CM

## 2017-03-05 DIAGNOSIS — Z87898 Personal history of other specified conditions: Secondary | ICD-10-CM | POA: Diagnosis not present

## 2017-03-05 DIAGNOSIS — F39 Unspecified mood [affective] disorder: Secondary | ICD-10-CM | POA: Insufficient documentation

## 2017-03-05 DIAGNOSIS — B20 Human immunodeficiency virus [HIV] disease: Secondary | ICD-10-CM | POA: Diagnosis not present

## 2017-03-05 DIAGNOSIS — M5442 Lumbago with sciatica, left side: Secondary | ICD-10-CM

## 2017-03-05 DIAGNOSIS — M542 Cervicalgia: Secondary | ICD-10-CM | POA: Insufficient documentation

## 2017-03-05 DIAGNOSIS — Z880 Allergy status to penicillin: Secondary | ICD-10-CM | POA: Insufficient documentation

## 2017-03-05 DIAGNOSIS — F17210 Nicotine dependence, cigarettes, uncomplicated: Secondary | ICD-10-CM | POA: Diagnosis not present

## 2017-03-05 DIAGNOSIS — G8929 Other chronic pain: Secondary | ICD-10-CM | POA: Diagnosis not present

## 2017-03-05 DIAGNOSIS — F3181 Bipolar II disorder: Secondary | ICD-10-CM | POA: Insufficient documentation

## 2017-03-05 DIAGNOSIS — M25571 Pain in right ankle and joints of right foot: Secondary | ICD-10-CM

## 2017-03-05 DIAGNOSIS — Z87828 Personal history of other (healed) physical injury and trauma: Secondary | ICD-10-CM | POA: Insufficient documentation

## 2017-03-05 DIAGNOSIS — F1910 Other psychoactive substance abuse, uncomplicated: Secondary | ICD-10-CM | POA: Diagnosis not present

## 2017-03-05 DIAGNOSIS — M25561 Pain in right knee: Secondary | ICD-10-CM

## 2017-03-05 DIAGNOSIS — Z8614 Personal history of Methicillin resistant Staphylococcus aureus infection: Secondary | ICD-10-CM | POA: Diagnosis not present

## 2017-03-05 DIAGNOSIS — Z79891 Long term (current) use of opiate analgesic: Secondary | ICD-10-CM

## 2017-03-05 DIAGNOSIS — K7460 Unspecified cirrhosis of liver: Secondary | ICD-10-CM | POA: Insufficient documentation

## 2017-03-05 DIAGNOSIS — M25562 Pain in left knee: Secondary | ICD-10-CM

## 2017-03-05 DIAGNOSIS — I10 Essential (primary) hypertension: Secondary | ICD-10-CM | POA: Diagnosis not present

## 2017-03-05 DIAGNOSIS — F209 Schizophrenia, unspecified: Secondary | ICD-10-CM | POA: Insufficient documentation

## 2017-03-05 DIAGNOSIS — Z79899 Other long term (current) drug therapy: Secondary | ICD-10-CM | POA: Diagnosis not present

## 2017-03-05 DIAGNOSIS — F419 Anxiety disorder, unspecified: Secondary | ICD-10-CM | POA: Diagnosis not present

## 2017-03-05 DIAGNOSIS — F1190 Opioid use, unspecified, uncomplicated: Secondary | ICD-10-CM | POA: Diagnosis not present

## 2017-03-05 DIAGNOSIS — M879 Osteonecrosis, unspecified: Secondary | ICD-10-CM | POA: Diagnosis not present

## 2017-03-05 DIAGNOSIS — M5441 Lumbago with sciatica, right side: Secondary | ICD-10-CM | POA: Diagnosis not present

## 2017-03-05 MED ORDER — OXYCODONE HCL 10 MG PO TABS: 10.0000 mg | ORAL_TABLET | Freq: Four times a day (QID) | ORAL | 0 refills | Status: DC | PRN

## 2017-03-05 NOTE — Progress Notes (Signed)
Nursing Pain Medication Assessment:  Safety precautions to be maintained throughout the outpatient stay will include: orient to surroundings, keep bed in low position, maintain call bell within reach at all times, provide assistance with transfer out of bed and ambulation.  Medication Inspection Compliance: Pill count conducted under aseptic conditions, in front of the patient. Neither the pills nor the bottle was removed from the patient's sight at any time. Once count was completed pills were immediately returned to the patient in their original bottle.  Medication: Oxycodone IR Pill/Patch Count: 12 of 120 pills remain Pill/Patch Appearance: Markings consistent with prescribed medication Bottle Appearance: Standard pharmacy container. Clearly labeled. Filled Date: 08 / 23 / 2018 Last Medication intake:  Today
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
## 2017-03-05 NOTE — Patient Instructions (Addendum)
____________________________________________________________________________________________  Pain Scale  Introduction: The pain score used by this practice is the Verbal Numerical Rating Scale (VNRS-11). This is an 11-point scale. It is for adults and children 10 years or older. There are significant differences in how the pain score is reported, used, and applied. Forget everything you learned in the past and learn this scoring system.  General Information: The scale should reflect your current level of pain. Unless you are specifically asked for the level of your worst pain, or your average pain. If you are asked for one of these two, then it should be understood that it is over the past 24 hours.  Basic Activities of Daily Living (ADL): Personal hygiene, dressing, eating, transferring, and using restroom.  Instructions: Most patients tend to report their level of pain as a combination of two factors, their physical pain and their psychosocial pain. This last one is also known as "suffering" and it is reflection of how physical pain affects you socially and psychologically. From now on, report them separately. From this point on, when asked to report your pain level, report only your physical pain. Use the following table for reference.  Pain Clinic Pain Levels (0-5/10)  Pain Level Score  Description  No Pain 0   Mild pain 1 Nagging, annoying, but does not interfere with basic activities of daily living (ADL). Patients are able to eat, bathe, get dressed, toileting (being able to get on and off the toilet and perform personal hygiene functions), transfer (move in and out of bed or a chair without assistance), and maintain continence (able to control bladder and bowel functions). Blood pressure and heart rate are unaffected. A normal heart rate for a healthy adult ranges from 60 to 100 bpm (beats per minute).   Mild to moderate pain 2 Noticeable and distracting. Impossible to hide from other  people. More frequent flare-ups. Still possible to adapt and function close to normal. It can be very annoying and may have occasional stronger flare-ups. With discipline, patients may get used to it and adapt.   Moderate pain 3 Interferes significantly with activities of daily living (ADL). It becomes difficult to feed, bathe, get dressed, get on and off the toilet or to perform personal hygiene functions. Difficult to get in and out of bed or a chair without assistance. Very distracting. With effort, it can be ignored when deeply involved in activities.   Moderately severe pain 4 Impossible to ignore for more than a few minutes. With effort, patients may still be able to manage work or participate in some social activities. Very difficult to concentrate. Signs of autonomic nervous system discharge are evident: dilated pupils (mydriasis); mild sweating (diaphoresis); sleep interference. Heart rate becomes elevated (>115 bpm). Diastolic blood pressure (lower number) rises above 100 mmHg. Patients find relief in laying down and not moving.   Severe pain 5 Intense and extremely unpleasant. Associated with frowning face and frequent crying. Pain overwhelms the senses.  Ability to do any activity or maintain social relationships becomes significantly limited. Conversation becomes difficult. Pacing back and forth is common, as getting into a comfortable position is nearly impossible. Pain wakes you up from deep sleep. Physical signs will be obvious: pupillary dilation; increased sweating; goosebumps; brisk reflexes; cold, clammy hands and feet; nausea, vomiting or dry heaves; loss of appetite; significant sleep disturbance with inability to fall asleep or to remain asleep. When persistent, significant weight loss is observed due to the complete loss of appetite and sleep deprivation.  Blood   pressure and heart rate becomes significantly elevated. Caution: If elevated blood pressure triggers a pounding headache  associated with blurred vision, then the patient should immediately seek attention at an urgent or emergency care unit, as these may be signs of an impending stroke.    Emergency Department Pain Levels (6-10/10)  Emergency Room Pain 6 Severely limiting. Requires emergency care and should not be seen or managed at an outpatient pain management facility. Communication becomes difficult and requires great effort. Assistance to reach the emergency department may be required. Facial flushing and profuse sweating along with potentially dangerous increases in heart rate and blood pressure will be evident.   Distressing pain 7 Self-care is very difficult. Assistance is required to transport, or use restroom. Assistance to reach the emergency department will be required. Tasks requiring coordination, such as bathing and getting dressed become very difficult.   Disabling pain 8 Self-care is no longer possible. At this level, pain is disabling. The individual is unable to do even the most "basic" activities such as walking, eating, bathing, dressing, transferring to a bed, or toileting. Fine motor skills are lost. It is difficult to think clearly.   Incapacitating pain 9 Pain becomes incapacitating. Thought processing is no longer possible. Difficult to remember your own name. Control of movement and coordination are lost.   The worst pain imaginable 10 At this level, most patients pass out from pain. When this level is reached, collapse of the autonomic nervous system occurs, leading to a sudden drop in blood pressure and heart rate. This in turn results in a temporary and dramatic drop in blood flow to the brain, leading to a loss of consciousness. Fainting is one of the body's self defense mechanisms. Passing out puts the brain in a calmed state and causes it to shut down for a while, in order to begin the healing process.    Summary: 1. Refer to this scale when providing [place] with your pain level. 2. Be  accurate and careful when reporting your pain level. This will help with your care. 3. Over-reporting your pain level will lead to loss of credibility. 4. Even a level of 1/10 means that there is pain and will be treated at our facility. 5. High, inaccurate reporting will be documented as "Symptom Exaggeration", leading to loss of credibility and suspicions of possible secondary gains such as obtaining more narcotics, or wanting to appear disabled, for fraudulent reasons. 6. Only pain levels of 5 or below will be seen at our facility. 7. Pain levels of 6 and above will be sent to the Emergency Department and the appointment cancelled. ____________________________________________________________________________________________  Prescription given for Oxycodone 10 mg

## 2017-03-28 ENCOUNTER — Ambulatory Visit: Payer: Medicaid Other | Admitting: Diagnostic Neuroimaging

## 2017-04-07 ENCOUNTER — Ambulatory Visit: Payer: Medicaid Other | Admitting: Nurse Practitioner

## 2017-04-07 ENCOUNTER — Encounter (INDEPENDENT_AMBULATORY_CARE_PROVIDER_SITE_OTHER): Payer: Self-pay

## 2017-04-07 ENCOUNTER — Ambulatory Visit (INDEPENDENT_AMBULATORY_CARE_PROVIDER_SITE_OTHER): Payer: Medicaid Other | Admitting: Diagnostic Neuroimaging

## 2017-04-07 ENCOUNTER — Encounter: Payer: Self-pay | Admitting: Diagnostic Neuroimaging

## 2017-04-07 VITALS — BP 127/78 | HR 76 | Ht 69.5 in | Wt 197.0 lb

## 2017-04-07 DIAGNOSIS — S065X5S Traumatic subdural hemorrhage with loss of consciousness greater than 24 hours with return to pre-existing conscious level, sequela: Secondary | ICD-10-CM | POA: Diagnosis not present
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
## 2017-04-07 NOTE — Patient Instructions (Addendum)
-   history of traumatic subdural hematoma in 2016, s/p craniotomy  - no neurologic contraindication for anticipated left knee surgery

## 2017-04-08 NOTE — Progress Notes (Signed)
04/07/17 office note and Pre-op surgery form successfully faxed to [facility].

## 2017-04-17 ENCOUNTER — Other Ambulatory Visit: Payer: Self-pay | Admitting: Orthopedic Surgery

## 2017-04-17 NOTE — Pre-Procedure Instructions (Signed)
Jesse Munoz  04/17/2017      [id number] [place], [place] [address] [place] [address] Phone: [phone] Fax: [phone]    Your procedure is scheduled on November 13  Report to [facility] at 1145 A.M.  Call this number if you have problems the morning of surgery:  [phone]   Remember:  Do not eat food or drink liquids after midnight.  Continue all other medications as directed by your physician except follow these medication instructions before surgery   Take these medicines the morning of surgery with A SIP OF WATER  gabapentin (NEURONTIN) Oxycodone HCl methadone (DOLOPHINE)  7 days prior to surgery STOP taking any Aspirin (unless otherwise instructed by your surgeon), Aleve, Naproxen, Ibuprofen, Motrin, Advil, Goody's, BC's, all herbal medications, fish oil, and all vitamins   Do not wear jewelry  Do not wear lotions, powders, or cologne, or deoderant.  Men may shave face and neck.  Do not bring valuables to the hospital.  [facility] is not responsible for any belongings or valuables.  Contacts, dentures or bridgework may not be worn into surgery.  Leave your suitcase in the car.  After surgery it may be brought to your room.  For patients admitted to the hospital, discharge time will be determined by your treatment team.  Patients discharged the day of surgery will not be allowed to drive home.    Special instructions:   [facility]- Preparing For Surgery  Before surgery, you can play an important role. Because skin is not sterile, your skin needs to be as free of germs as possible. You can reduce the number of germs on your skin by washing with CHG (chlorahexidine gluconate) Soap before surgery.  CHG is an antiseptic cleaner which kills germs and bonds with the skin to continue killing germs even after washing.  Please do not use if you have an allergy to  CHG or antibacterial soaps. If your skin becomes reddened/irritated stop using the CHG.  Do not shave (including legs and underarms) for at least 48 hours prior to first CHG shower. It is OK to shave your face.  Please follow these instructions carefully.   1. Shower the NIGHT BEFORE SURGERY and the MORNING OF SURGERY with CHG.   2. If you chose to wash your hair, wash your hair first as usual with your normal shampoo.  3. After you shampoo, rinse your hair and body thoroughly to remove the shampoo.  4. Use CHG as you would any other liquid soap. You can apply CHG directly to the skin and wash gently with a scrungie or a clean washcloth.   5. Apply the CHG Soap to your body ONLY FROM THE NECK DOWN.  Do not use on open wounds or open sores. Avoid contact with your eyes, ears, mouth and genitals (private parts). Wash Face and genitals (private parts)  with your normal soap.  6. Wash thoroughly, paying special attention to the area where your surgery will be performed.  7. Thoroughly rinse your body with warm water from the neck down.  8. DO NOT shower/wash with your normal soap after using and rinsing off the CHG Soap.  9. Pat yourself dry with a CLEAN TOWEL.  10. Wear CLEAN PAJAMAS to bed the night before surgery, wear comfortable clothes the morning of surgery  11. Place CLEAN SHEETS on your bed the night of your first shower and  DO NOT SLEEP WITH PETS.    Day of Surgery: Do not apply any deodorants/lotions. Please wear clean clothes to the hospital/surgery center.      Please read over the following fact sheets that you were given.

## 2017-04-18 ENCOUNTER — Inpatient Hospital Stay (HOSPITAL_COMMUNITY)
Admission: RE | Admit: 2017-04-18 | Discharge: 2017-04-18 | Disposition: A | Discharge status: Home / Self Care | Payer: Medicaid Other | Source: Ambulatory Visit

## 2017-04-22 ENCOUNTER — Encounter (HOSPITAL_COMMUNITY): Payer: Self-pay

## 2017-04-22 ENCOUNTER — Encounter (HOSPITAL_COMMUNITY)
Admission: RE | Admit: 2017-04-22 | Discharge: 2017-04-22 | Disposition: A | Discharge status: Home / Self Care | Payer: Medicaid Other | Source: Ambulatory Visit | Attending: Orthopedic Surgery | Admitting: Orthopedic Surgery

## 2017-04-22 ENCOUNTER — Other Ambulatory Visit: Payer: Self-pay

## 2017-04-22 DIAGNOSIS — Z01812 Encounter for preprocedural laboratory examination: Secondary | ICD-10-CM | POA: Diagnosis not present

## 2017-04-22 DIAGNOSIS — Z01810 Encounter for preprocedural cardiovascular examination: Secondary | ICD-10-CM | POA: Diagnosis present

## 2017-04-22 HISTORY — DX: Schizophrenia, unspecified: F20.9

## 2017-04-22 LAB — COMPREHENSIVE METABOLIC PANEL
ALBUMIN: 4.1 g/dL (ref 3.5–5.0)
ALT: 16 U/L — ABNORMAL LOW (ref 17–63)
AST: 30 U/L (ref 15–41)
Alkaline Phosphatase: 91 U/L (ref 38–126)
Anion gap: 11 (ref 5–15)
BUN: 13 mg/dL (ref 6–20)
CO2: 29 mmol/L (ref 22–32)
CREATININE: 1.01 mg/dL (ref 0.61–1.24)
Calcium: 10.1 mg/dL (ref 8.9–10.3)
Chloride: 95 mmol/L — ABNORMAL LOW (ref 101–111)
GFR calc Af Amer: >60 mL/min (ref >60)
Glucose, Bld: 88 mg/dL (ref 65–99)
Potassium: 3.4 mmol/L — ABNORMAL LOW (ref 3.5–5.1)
SODIUM: 135 mmol/L (ref 135–145)
TOTAL PROTEIN: 8.9 g/dL — AB (ref 6.5–8.1)
Total Bilirubin: 0.6 mg/dL (ref 0.3–1.2)

## 2017-04-22 LAB — CBC
HCT: 45.1 % (ref 39.0–52.0)
Hemoglobin: 15.1 g/dL (ref 13.0–17.0)
MCH: 27.1 pg (ref 26.0–34.0)
MCHC: 33.5 g/dL (ref 30.0–36.0)
MCV: 81 fL (ref 78.0–100.0)
PLATELETS: 185 10*3/uL (ref 150–400)
RBC: 5.57 MIL/uL (ref 4.22–5.81)
RDW: 13.6 % (ref 11.5–15.5)
WBC: 9.2 10*3/uL (ref 4.0–10.5)

## 2017-04-22 LAB — SURGICAL PCR SCREEN
MRSA, PCR: NEGATIVE
STAPHYLOCOCCUS AUREUS: POSITIVE — AB

## 2017-04-22 NOTE — Progress Notes (Addendum)
PCP: [facility]  [place]  Neurologist: Dr. Cyndi Bender in epic  Pt. Blood pressure elevated. Stated he didn't take meds today.  [facility] [address]

## 2017-04-24 ENCOUNTER — Encounter (HOSPITAL_COMMUNITY): Payer: Self-pay
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
## 2017-04-29 ENCOUNTER — Encounter (HOSPITAL_COMMUNITY)
Admission: RE | Disposition: A | Discharge status: Home / Self Care | Payer: Self-pay | Source: Home / Self Care | Attending: Orthopedic Surgery

## 2017-04-29 ENCOUNTER — Encounter (HOSPITAL_COMMUNITY): Payer: Self-pay | Admitting: *Deleted

## 2017-04-29 ENCOUNTER — Inpatient Hospital Stay (HOSPITAL_COMMUNITY): Payer: Medicaid Other | Admitting: Emergency Medicine

## 2017-04-29 ENCOUNTER — Other Ambulatory Visit: Payer: Self-pay

## 2017-04-29 ENCOUNTER — Inpatient Hospital Stay (HOSPITAL_COMMUNITY)
Admission: RE | Admit: 2017-04-29 | Discharge: 2017-05-01 | DRG: 554 | Disposition: A | Discharge status: Home / Self Care | Payer: Medicaid Other | Attending: Orthopedic Surgery | Admitting: Orthopedic Surgery

## 2017-04-29 ENCOUNTER — Inpatient Hospital Stay (HOSPITAL_COMMUNITY): Payer: Medicaid Other | Admitting: Certified Registered Nurse Anesthetist

## 2017-04-29 DIAGNOSIS — Z87820 Personal history of traumatic brain injury: Secondary | ICD-10-CM

## 2017-04-29 DIAGNOSIS — I361 Nonrheumatic tricuspid (valve) insufficiency: Secondary | ICD-10-CM | POA: Diagnosis not present

## 2017-04-29 DIAGNOSIS — I10 Essential (primary) hypertension: Secondary | ICD-10-CM | POA: Diagnosis present

## 2017-04-29 DIAGNOSIS — G8929 Other chronic pain: Secondary | ICD-10-CM | POA: Diagnosis present

## 2017-04-29 DIAGNOSIS — Z79899 Other long term (current) drug therapy: Secondary | ICD-10-CM | POA: Diagnosis not present

## 2017-04-29 DIAGNOSIS — F17210 Nicotine dependence, cigarettes, uncomplicated: Secondary | ICD-10-CM | POA: Diagnosis present

## 2017-04-29 DIAGNOSIS — Z21 Asymptomatic human immunodeficiency virus [HIV] infection status: Secondary | ICD-10-CM | POA: Diagnosis present

## 2017-04-29 DIAGNOSIS — Z8673 Personal history of transient ischemic attack (TIA), and cerebral infarction without residual deficits: Secondary | ICD-10-CM

## 2017-04-29 DIAGNOSIS — Z880 Allergy status to penicillin: Secondary | ICD-10-CM | POA: Diagnosis not present

## 2017-04-29 DIAGNOSIS — M545 Low back pain: Secondary | ICD-10-CM | POA: Diagnosis present

## 2017-04-29 DIAGNOSIS — M879 Osteonecrosis, unspecified: Secondary | ICD-10-CM | POA: Diagnosis present

## 2017-04-29 DIAGNOSIS — R9431 Abnormal electrocardiogram [ECG] [EKG]: Secondary | ICD-10-CM

## 2017-04-29 DIAGNOSIS — I219 Acute myocardial infarction, unspecified: Secondary | ICD-10-CM | POA: Diagnosis not present

## 2017-04-29 DIAGNOSIS — Z538 Procedure and treatment not carried out for other reasons: Secondary | ICD-10-CM | POA: Diagnosis not present

## 2017-04-29 DIAGNOSIS — Z981 Arthrodesis status: Secondary | ICD-10-CM | POA: Diagnosis not present

## 2017-04-29 DIAGNOSIS — Z8614 Personal history of Methicillin resistant Staphylococcus aureus infection: Secondary | ICD-10-CM | POA: Diagnosis not present

## 2017-04-29 DIAGNOSIS — Z79891 Long term (current) use of opiate analgesic: Secondary | ICD-10-CM | POA: Diagnosis not present

## 2017-04-29 DIAGNOSIS — F209 Schizophrenia, unspecified: Secondary | ICD-10-CM | POA: Diagnosis present

## 2017-04-29 DIAGNOSIS — M87052 Idiopathic aseptic necrosis of left femur: Secondary | ICD-10-CM | POA: Diagnosis present

## 2017-04-29 DIAGNOSIS — F419 Anxiety disorder, unspecified: Secondary | ICD-10-CM | POA: Diagnosis present

## 2017-04-29 DIAGNOSIS — F319 Bipolar disorder, unspecified: Secondary | ICD-10-CM | POA: Diagnosis present

## 2017-04-29 DIAGNOSIS — R079 Chest pain, unspecified: Secondary | ICD-10-CM | POA: Diagnosis not present

## 2017-04-29 DIAGNOSIS — M1712 Unilateral primary osteoarthritis, left knee: Secondary | ICD-10-CM | POA: Diagnosis present

## 2017-04-29 DIAGNOSIS — Z23 Encounter for immunization: Secondary | ICD-10-CM | POA: Diagnosis not present

## 2017-04-29 DIAGNOSIS — M25562 Pain in left knee: Secondary | ICD-10-CM | POA: Diagnosis present

## 2017-04-29 DIAGNOSIS — G894 Chronic pain syndrome: Secondary | ICD-10-CM

## 2017-04-29 DIAGNOSIS — F4310 Post-traumatic stress disorder, unspecified: Secondary | ICD-10-CM | POA: Diagnosis present

## 2017-04-29 HISTORY — DX: Bipolar disorder, unspecified: F31.9

## 2017-04-29 HISTORY — DX: Other chronic pain: G89.29

## 2017-04-29 HISTORY — DX: Idiopathic aseptic necrosis of left femur: M87.052

## 2017-04-29 HISTORY — DX: Low back pain, unspecified: M54.50

## 2017-04-29 HISTORY — DX: Low back pain: M54.5

## 2017-04-29 HISTORY — PX: WOUND EXPLORATION: SHX6188

## 2017-04-29 HISTORY — PX: KNEE SURGERY: SHX244

## 2017-04-29 SURGERY — WOUND EXPLORATION
Anesthesia: General | Site: Knee | Laterality: Left

## 2017-04-29 MED ORDER — LIDOCAINE HCL (CARDIAC) 20 MG/ML IV SOLN
INTRAVENOUS | Status: DC | PRN
  Administered 2017-04-29: 80 mg via INTRAVENOUS

## 2017-04-29 MED ORDER — SUGAMMADEX SODIUM 200 MG/2ML IV SOLN
INTRAVENOUS | Status: AC
Start: 2017-04-29 — End: 2017-04-29
  Filled 2017-04-29: qty 2

## 2017-04-29 MED ORDER — OXYCODONE HCL 5 MG PO TABS: 5.0000 mg | ORAL_TABLET | ORAL | Status: DC | PRN

## 2017-04-29 MED ORDER — VANCOMYCIN HCL IN DEXTROSE 1-5 GM/200ML-% IV SOLN
1000.0000 mg | Freq: Once | INTRAVENOUS | Status: AC
  Administered 2017-04-29: 1000 mg via INTRAVENOUS

## 2017-04-29 MED ORDER — MIDAZOLAM HCL 2 MG/2ML IJ SOLN
INTRAMUSCULAR | Status: AC
  Administered 2017-04-29: 1 mg via INTRAVENOUS
  Filled 2017-04-29: qty 2

## 2017-04-29 MED ORDER — PNEUMOCOCCAL VAC POLYVALENT 25 MCG/0.5ML IJ INJ
0.5000 mL | INJECTION | INTRAMUSCULAR | Status: AC
  Administered 2017-04-30: 0.5 mL via INTRAMUSCULAR
  Filled 2017-04-29: qty 0.5

## 2017-04-29 MED ORDER — FENTANYL CITRATE (PF) 100 MCG/2ML IJ SOLN
50.0000 ug | Freq: Once | INTRAMUSCULAR | Status: AC
  Administered 2017-04-29: 50 ug via INTRAVENOUS

## 2017-04-29 MED ORDER — FERROUS SULFATE 325 (65 FE) MG PO TABS
325.0000 mg | ORAL_TABLET | Freq: Every day | ORAL | Status: DC
  Administered 2017-04-29 – 2017-05-01 (×3): 325 mg via ORAL
  Filled 2017-04-29 (×3): qty 1

## 2017-04-29 MED ORDER — METHADONE HCL 10 MG/5ML PO SOLN: 50.0000 mg | Freq: Every day | ORAL | Status: DC

## 2017-04-29 MED ORDER — ZOLPIDEM TARTRATE 5 MG PO TABS: 5.0000 mg | ORAL_TABLET | Freq: Every evening | ORAL | Status: DC | PRN

## 2017-04-29 MED ORDER — ACETAMINOPHEN 650 MG RE SUPP: 650.0000 mg | RECTAL | Status: DC | PRN

## 2017-04-29 MED ORDER — ONDANSETRON HCL 4 MG/2ML IJ SOLN
INTRAMUSCULAR | Status: AC
  Filled 2017-04-29: qty 2

## 2017-04-29 MED ORDER — HYDROMORPHONE HCL 1 MG/ML IJ SOLN: 1.0000 mg | INTRAMUSCULAR | Status: DC | PRN

## 2017-04-29 MED ORDER — KETAMINE HCL-SODIUM CHLORIDE 100-0.9 MG/10ML-% IV SOSY
PREFILLED_SYRINGE | INTRAVENOUS | Status: AC
  Filled 2017-04-29: qty 10

## 2017-04-29 MED ORDER — KETAMINE HCL 10 MG/ML IJ SOLN
INTRAMUSCULAR | Status: DC | PRN
  Administered 2017-04-29 (×2): 50 mg via INTRAVENOUS

## 2017-04-29 MED ORDER — VITAMIN C 500 MG PO TABS
500.0000 mg | ORAL_TABLET | Freq: Every day | ORAL | Status: DC
  Administered 2017-04-29 – 2017-05-01 (×3): 500 mg via ORAL
  Filled 2017-04-29 (×3): qty 1

## 2017-04-29 MED ORDER — BACLOFEN 10 MG PO TABS: 10.0000 mg | ORAL_TABLET | Freq: Three times a day (TID) | ORAL | 0 refills | Status: DC

## 2017-04-29 MED ORDER — MAGNESIUM CITRATE PO SOLN: 1.0000 | Freq: Once | ORAL | Status: DC | PRN

## 2017-04-29 MED ORDER — SUGAMMADEX SODIUM 200 MG/2ML IV SOLN
INTRAVENOUS | Status: DC | PRN
  Administered 2017-04-29: 160 mg via INTRAVENOUS

## 2017-04-29 MED ORDER — PHENOL 1.4 % MT LIQD: 1.0000 | OROMUCOSAL | Status: DC | PRN

## 2017-04-29 MED ORDER — ELVITEG-COBIC-EMTRICIT-TENOFAF 150-150-200-10 MG PO TABS
1.0000 | ORAL_TABLET | Freq: Every day | ORAL | Status: DC
  Administered 2017-04-30 – 2017-05-01 (×2): 1 via ORAL
  Filled 2017-04-29 (×2): qty 1

## 2017-04-29 MED ORDER — ONDANSETRON HCL 4 MG/2ML IJ SOLN
INTRAMUSCULAR | Status: DC | PRN
  Administered 2017-04-29: 4 mg via INTRAVENOUS

## 2017-04-29 MED ORDER — VANCOMYCIN HCL IN DEXTROSE 1-5 GM/200ML-% IV SOLN
INTRAVENOUS | Status: AC
  Filled 2017-04-29: qty 200

## 2017-04-29 MED ORDER — METOCLOPRAMIDE HCL 5 MG PO TABS: 5.0000 mg | ORAL_TABLET | Freq: Three times a day (TID) | ORAL | Status: DC | PRN

## 2017-04-29 MED ORDER — PROMETHAZINE HCL 25 MG PO TABS: 25.0000 mg | ORAL_TABLET | Freq: Four times a day (QID) | ORAL | 5 refills | Status: DC | PRN

## 2017-04-29 MED ORDER — OXYCODONE HCL 10 MG PO TABS: 10.0000 mg | ORAL_TABLET | Freq: Four times a day (QID) | ORAL | 0 refills | Status: DC | PRN

## 2017-04-29 MED ORDER — KETOROLAC TROMETHAMINE 15 MG/ML IJ SOLN
7.5000 mg | Freq: Four times a day (QID) | INTRAMUSCULAR | Status: AC
  Administered 2017-04-29 – 2017-04-30 (×4): 7.5 mg via INTRAVENOUS
  Filled 2017-04-29 (×4): qty 1

## 2017-04-29 MED ORDER — LISINOPRIL 40 MG PO TABS
40.0000 mg | ORAL_TABLET | Freq: Every day | ORAL | Status: DC
  Administered 2017-04-30 – 2017-05-01 (×2): 40 mg via ORAL
  Filled 2017-04-29 (×2): qty 1

## 2017-04-29 MED ORDER — BISACODYL 10 MG RE SUPP: 10.0000 mg | Freq: Every day | RECTAL | Status: DC | PRN

## 2017-04-29 MED ORDER — DEXAMETHASONE SODIUM PHOSPHATE 10 MG/ML IJ SOLN
10.0000 mg | Freq: Once | INTRAMUSCULAR | Status: AC
  Administered 2017-04-30: 10 mg via INTRAVENOUS
  Filled 2017-04-29: qty 1

## 2017-04-29 MED ORDER — SENNA 8.6 MG PO TABS
1.0000 | ORAL_TABLET | Freq: Two times a day (BID) | ORAL | Status: DC
  Administered 2017-04-29 – 2017-05-01 (×4): 8.6 mg via ORAL
  Filled 2017-04-29 (×4): qty 1

## 2017-04-29 MED ORDER — BUPIVACAINE HCL (PF) 0.25 % IJ SOLN
INTRAMUSCULAR | Status: DC | PRN
  Administered 2017-04-29: 30 mL

## 2017-04-29 MED ORDER — CEFAZOLIN SODIUM-DEXTROSE 2-4 GM/100ML-% IV SOLN
2.0000 g | Freq: Four times a day (QID) | INTRAVENOUS | Status: AC
  Administered 2017-04-29 – 2017-04-30 (×2): 2 g via INTRAVENOUS
  Filled 2017-04-29 (×2): qty 100

## 2017-04-29 MED ORDER — METHOCARBAMOL 500 MG PO TABS: 500.0000 mg | ORAL_TABLET | Freq: Four times a day (QID) | ORAL | Status: DC | PRN

## 2017-04-29 MED ORDER — GABAPENTIN 300 MG PO CAPS
300.0000 mg | ORAL_CAPSULE | Freq: Three times a day (TID) | ORAL | Status: DC
  Administered 2017-04-29 – 2017-05-01 (×5): 300 mg via ORAL
  Filled 2017-04-29 (×5): qty 1

## 2017-04-29 MED ORDER — DIPHENHYDRAMINE HCL 12.5 MG/5ML PO ELIX: 12.5000 mg | ORAL_SOLUTION | ORAL | Status: DC | PRN

## 2017-04-29 MED ORDER — MIDAZOLAM HCL 5 MG/5ML IJ SOLN
INTRAMUSCULAR | Status: DC | PRN
  Administered 2017-04-29: 2 mg via INTRAVENOUS

## 2017-04-29 MED ORDER — POTASSIUM CHLORIDE IN NACL 20-0.45 MEQ/L-% IV SOLN
INTRAVENOUS | Status: DC
  Administered 2017-04-30: 01:00:00 via INTRAVENOUS
  Filled 2017-04-29: qty 1000

## 2017-04-29 MED ORDER — LACTATED RINGERS IV SOLN
INTRAVENOUS | Status: DC
  Administered 2017-04-29 (×2): via INTRAVENOUS

## 2017-04-29 MED ORDER — ROCURONIUM BROMIDE 100 MG/10ML IV SOLN
INTRAVENOUS | Status: DC | PRN
  Administered 2017-04-29: 50 mg via INTRAVENOUS

## 2017-04-29 MED ORDER — FENTANYL CITRATE (PF) 250 MCG/5ML IJ SOLN
INTRAMUSCULAR | Status: AC
  Filled 2017-04-29: qty 5

## 2017-04-29 MED ORDER — RIVAROXABAN 10 MG PO TABS
10.0000 mg | ORAL_TABLET | Freq: Every day | ORAL | Status: DC
Start: 2017-04-30 — End: 2017-04-29

## 2017-04-29 MED ORDER — METHADONE HCL 10 MG PO TABS
50.0000 mg | ORAL_TABLET | Freq: Every day | ORAL | Status: DC
  Administered 2017-04-30 – 2017-05-01 (×2): 50 mg via ORAL
  Filled 2017-04-29 (×2): qty 5

## 2017-04-29 MED ORDER — CEFAZOLIN SODIUM-DEXTROSE 2-4 GM/100ML-% IV SOLN
2.0000 g | INTRAVENOUS | Status: AC
  Administered 2017-04-29: 2 g via INTRAVENOUS
  Filled 2017-04-29: qty 100

## 2017-04-29 MED ORDER — ROCURONIUM BROMIDE 10 MG/ML (PF) SYRINGE
PREFILLED_SYRINGE | INTRAVENOUS | Status: AC
  Filled 2017-04-29: qty 5

## 2017-04-29 MED ORDER — FENTANYL CITRATE (PF) 100 MCG/2ML IJ SOLN
INTRAMUSCULAR | Status: DC | PRN
  Administered 2017-04-29: 50 ug via INTRAVENOUS
  Administered 2017-04-29 (×2): 100 ug via INTRAVENOUS

## 2017-04-29 MED ORDER — ACETAMINOPHEN 325 MG PO TABS: 650.0000 mg | ORAL_TABLET | ORAL | Status: DC | PRN

## 2017-04-29 MED ORDER — ONDANSETRON HCL 4 MG/2ML IJ SOLN
4.0000 mg | Freq: Four times a day (QID) | INTRAMUSCULAR | Status: DC | PRN
  Administered 2017-05-01: 4 mg via INTRAVENOUS
  Filled 2017-04-29: qty 2

## 2017-04-29 MED ORDER — OXYCODONE HCL 5 MG PO TABS
ORAL_TABLET | ORAL | Status: AC
  Administered 2017-04-29: 10 mg via ORAL
  Filled 2017-04-29: qty 2

## 2017-04-29 MED ORDER — SENNA-DOCUSATE SODIUM 8.6-50 MG PO TABS: 2.0000 | ORAL_TABLET | Freq: Every day | ORAL | 1 refills | Status: DC

## 2017-04-29 MED ORDER — CITALOPRAM HYDROBROMIDE 20 MG PO TABS
20.0000 mg | ORAL_TABLET | Freq: Every day | ORAL | Status: DC
  Administered 2017-04-30 – 2017-05-01 (×2): 20 mg via ORAL
  Filled 2017-04-29 (×2): qty 1

## 2017-04-29 MED ORDER — POLYETHYLENE GLYCOL 3350 17 G PO PACK: 17.0000 g | PACK | Freq: Every day | ORAL | Status: DC | PRN

## 2017-04-29 MED ORDER — PROPOFOL 10 MG/ML IV BOLUS
INTRAVENOUS | Status: AC
  Filled 2017-04-29: qty 20

## 2017-04-29 MED ORDER — HYDROMORPHONE HCL 2 MG PO TABS: 2.0000 mg | ORAL_TABLET | ORAL | 0 refills | Status: DC | PRN

## 2017-04-29 MED ORDER — ONDANSETRON HCL 4 MG PO TABS: 4.0000 mg | ORAL_TABLET | Freq: Four times a day (QID) | ORAL | Status: DC | PRN

## 2017-04-29 MED ORDER — RIVAROXABAN 10 MG PO TABS: 10.0000 mg | ORAL_TABLET | Freq: Every day | ORAL | 0 refills | Status: DC

## 2017-04-29 MED ORDER — METHOCARBAMOL 1000 MG/10ML IJ SOLN
500.0000 mg | Freq: Four times a day (QID) | INTRAVENOUS | Status: DC | PRN
  Filled 2017-04-29: qty 5

## 2017-04-29 MED ORDER — MENTHOL 3 MG MT LOZG: 1.0000 | LOZENGE | OROMUCOSAL | Status: DC | PRN

## 2017-04-29 MED ORDER — FENTANYL CITRATE (PF) 100 MCG/2ML IJ SOLN
INTRAMUSCULAR | Status: AC
  Administered 2017-04-29: 50 ug via INTRAVENOUS
  Filled 2017-04-29: qty 2

## 2017-04-29 MED ORDER — CHLORTHALIDONE 50 MG PO TABS
50.0000 mg | ORAL_TABLET | Freq: Every day | ORAL | Status: DC
  Administered 2017-04-30 – 2017-05-01 (×2): 50 mg via ORAL
  Filled 2017-04-29 (×2): qty 1

## 2017-04-29 MED ORDER — METOCLOPRAMIDE HCL 5 MG/ML IJ SOLN: 5.0000 mg | Freq: Three times a day (TID) | INTRAMUSCULAR | Status: DC | PRN

## 2017-04-29 MED ORDER — HYDROMORPHONE HCL 1 MG/ML IJ SOLN
0.2500 mg | INTRAMUSCULAR | Status: DC | PRN
Start: 2017-04-29 — End: 2017-04-29
  Administered 2017-04-29 (×2): 0.5 mg via INTRAVENOUS

## 2017-04-29 MED ORDER — PROPOFOL 10 MG/ML IV BOLUS
INTRAVENOUS | Status: DC | PRN
  Administered 2017-04-29: 200 mg via INTRAVENOUS

## 2017-04-29 MED ORDER — OXYCODONE HCL 5 MG PO TABS
10.0000 mg | ORAL_TABLET | ORAL | Status: DC | PRN
  Administered 2017-04-29 – 2017-05-01 (×7): 10 mg via ORAL
  Filled 2017-04-29 (×6): qty 2

## 2017-04-29 MED ORDER — VITAMIN D 1000 UNITS PO TABS
1000.0000 [IU] | ORAL_TABLET | Freq: Every day | ORAL | Status: DC
  Administered 2017-04-29 – 2017-05-01 (×3): 1000 [IU] via ORAL
  Filled 2017-04-29 (×3): qty 1

## 2017-04-29 MED ORDER — ALUM & MAG HYDROXIDE-SIMETH 200-200-20 MG/5ML PO SUSP: 30.0000 mL | ORAL | Status: DC | PRN

## 2017-04-29 MED ORDER — PHENYLEPHRINE 40 MCG/ML (10ML) SYRINGE FOR IV PUSH (FOR BLOOD PRESSURE SUPPORT)
PREFILLED_SYRINGE | INTRAVENOUS | Status: AC
  Filled 2017-04-29: qty 10

## 2017-04-29 MED ORDER — POTASSIUM GLUCONATE 595 (99 K) MG PO TABS: 595.0000 mg | ORAL_TABLET | Freq: Every day | ORAL | Status: DC

## 2017-04-29 MED ORDER — HYDROMORPHONE HCL 1 MG/ML IJ SOLN
INTRAMUSCULAR | Status: AC
  Administered 2017-04-29: 0.5 mg via INTRAVENOUS
  Filled 2017-04-29: qty 1

## 2017-04-29 MED ORDER — INFLUENZA VAC SPLIT QUAD 0.5 ML IM SUSY
0.5000 mL | PREFILLED_SYRINGE | INTRAMUSCULAR | Status: AC
  Administered 2017-04-30: 0.5 mL via INTRAMUSCULAR
  Filled 2017-04-29: qty 0.5

## 2017-04-29 MED ORDER — MIDAZOLAM HCL 2 MG/2ML IJ SOLN
1.0000 mg | Freq: Once | INTRAMUSCULAR | Status: AC
  Administered 2017-04-29: 1 mg via INTRAVENOUS

## 2017-04-29 MED ORDER — BUPIVACAINE HCL (PF) 0.25 % IJ SOLN
INTRAMUSCULAR | Status: AC
  Filled 2017-04-29: qty 30

## 2017-04-29 MED ORDER — DOCUSATE SODIUM 100 MG PO CAPS
100.0000 mg | ORAL_CAPSULE | Freq: Two times a day (BID) | ORAL | Status: DC
  Administered 2017-04-29 – 2017-05-01 (×4): 100 mg via ORAL
  Filled 2017-04-29 (×4): qty 1

## 2017-04-29 MED ORDER — MIDAZOLAM HCL 2 MG/2ML IJ SOLN
INTRAMUSCULAR | Status: AC
  Filled 2017-04-29: qty 2

## 2017-04-29 MED ORDER — OXYCODONE HCL ER 10 MG PO T12A
10.0000 mg | EXTENDED_RELEASE_TABLET | Freq: Two times a day (BID) | ORAL | Status: DC
  Administered 2017-04-29 – 2017-05-01 (×4): 10 mg via ORAL
  Filled 2017-04-29 (×4): qty 1

## 2017-04-29 MED ORDER — QUETIAPINE FUMARATE 50 MG PO TABS
50.0000 mg | ORAL_TABLET | Freq: Every day | ORAL | Status: DC
  Filled 2017-04-29: qty 1

## 2017-04-29 MED ORDER — BUPIVACAINE LIPOSOME 1.3 % IJ SUSP
20.0000 mL | INTRAMUSCULAR | Status: AC
  Administered 2017-04-29: .1 mL
  Filled 2017-04-29: qty 20

## 2017-04-29 MED ORDER — LIDOCAINE 2% (20 MG/ML) 5 ML SYRINGE
INTRAMUSCULAR | Status: AC
  Filled 2017-04-29: qty 5

## 2017-04-29 MED ORDER — PHENYLEPHRINE HCL 10 MG/ML IJ SOLN
INTRAMUSCULAR | Status: DC | PRN
  Administered 2017-04-29 (×3): 80 ug via INTRAVENOUS

## 2017-04-29 SURGICAL SUPPLY — 53 items
BANDAGE ACE 6X5 VEL STRL LF (GAUZE/BANDAGES/DRESSINGS) ×3 IMPLANT
BANDAGE ESMARK 6X9 LF (GAUZE/BANDAGES/DRESSINGS) ×2 IMPLANT
BLADE SAG 18X100X1.27 (BLADE) ×3 IMPLANT
BLADE SAW SGTL 13X75X1.27 (BLADE) ×3 IMPLANT
BNDG ELASTIC 6X10 VLCR STRL LF (GAUZE/BANDAGES/DRESSINGS) ×6 IMPLANT
BNDG ESMARK 6X9 LF (GAUZE/BANDAGES/DRESSINGS) ×3
BOWL SMART MIX CTS (DISPOSABLE) ×3 IMPLANT
CLSR STERI-STRIP ANTIMIC 1/2X4 (GAUZE/BANDAGES/DRESSINGS) ×3 IMPLANT
COVER SURGICAL LIGHT HANDLE (MISCELLANEOUS) ×3 IMPLANT
CUFF TOURNIQUET SINGLE 34IN LL (TOURNIQUET CUFF) ×3 IMPLANT
DRAPE HALF SHEET 40X57 (DRAPES) ×3 IMPLANT
DRAPE U-SHAPE 47X51 STRL (DRAPES) ×3 IMPLANT
DRSG PAD ABDOMINAL 8X10 ST (GAUZE/BANDAGES/DRESSINGS) ×3 IMPLANT
DURAPREP 26ML APPLICATOR (WOUND CARE) ×3 IMPLANT
ELECT CAUTERY BLADE 6.4 (BLADE) ×3 IMPLANT
ELECT REM PT RETURN 9FT ADLT (ELECTROSURGICAL) ×3
ELECTRODE REM PT RTRN 9FT ADLT (ELECTROSURGICAL) ×2 IMPLANT
GAUZE SPONGE 4X4 12PLY STRL (GAUZE/BANDAGES/DRESSINGS) ×3 IMPLANT
GAUZE SPONGE 4X4 12PLY STRL LF (GAUZE/BANDAGES/DRESSINGS) ×3 IMPLANT
GAUZE XEROFORM 1X8 LF (GAUZE/BANDAGES/DRESSINGS) ×3 IMPLANT
GLOVE BIOGEL PI ORTHO PRO SZ8 (GLOVE) ×2
GLOVE ORTHO TXT STRL SZ7.5 (GLOVE) ×3 IMPLANT
GLOVE PI ORTHO PRO STRL SZ8 (GLOVE) ×4 IMPLANT
GLOVE SURG ORTHO 8.0 STRL STRW (GLOVE) ×3 IMPLANT
GOWN STRL REUS W/ TWL XL LVL3 (GOWN DISPOSABLE) ×2 IMPLANT
GOWN STRL REUS W/TWL 2XL LVL3 (GOWN DISPOSABLE) ×3 IMPLANT
GOWN STRL REUS W/TWL XL LVL3 (GOWN DISPOSABLE) ×1
HANDPIECE INTERPULSE COAX TIP (DISPOSABLE) ×1
HOOD PEEL AWAY FACE SHEILD DIS (HOOD) ×6 IMPLANT
HOOD PEEL AWAY FLYTE STAYCOOL (MISCELLANEOUS) ×6 IMPLANT
IMMOBILIZER KNEE 22 (SOFTGOODS) ×3 IMPLANT
KIT BASIN OR (CUSTOM PROCEDURE TRAY) ×3 IMPLANT
KIT ROOM TURNOVER OR (KITS) ×3 IMPLANT
MANIFOLD NEPTUNE II (INSTRUMENTS) ×3 IMPLANT
NS IRRIG 1000ML POUR BTL (IV SOLUTION) ×3 IMPLANT
PACK TOTAL JOINT (CUSTOM PROCEDURE TRAY) ×3 IMPLANT
PAD ABD 8X10 STRL (GAUZE/BANDAGES/DRESSINGS) ×3 IMPLANT
PAD ARMBOARD 7.5X6 YLW CONV (MISCELLANEOUS) ×6 IMPLANT
PAD CAST 4YDX4 CTTN HI CHSV (CAST SUPPLIES) ×2 IMPLANT
PADDING CAST COTTON 4X4 STRL (CAST SUPPLIES) ×1
PADDING CAST COTTON 6X4 STRL (CAST SUPPLIES) ×3 IMPLANT
SET HNDPC FAN SPRY TIP SCT (DISPOSABLE) ×2 IMPLANT
STAPLER VISISTAT 35W (STAPLE) ×3 IMPLANT
SUCTION FRAZIER HANDLE 10FR (MISCELLANEOUS) ×1
SUCTION TUBE FRAZIER 10FR DISP (MISCELLANEOUS) ×2 IMPLANT
SUT VIC AB 0 CT1 27 (SUTURE) ×1
SUT VIC AB 0 CT1 27XBRD ANBCTR (SUTURE) ×2 IMPLANT
SUT VIC AB 2-0 CT1 27 (SUTURE) ×1
SUT VIC AB 2-0 CT1 TAPERPNT 27 (SUTURE) ×2 IMPLANT
SUT VIC AB 3-0 SH 8-18 (SUTURE) ×6 IMPLANT
SYR 30ML LL (SYRINGE) ×3 IMPLANT
TOWEL OR 17X24 6PK STRL BLUE (TOWEL DISPOSABLE) ×3 IMPLANT
TOWEL OR 17X26 10 PK STRL BLUE (TOWEL DISPOSABLE) ×3 IMPLANT

## 2017-04-29 NOTE — Anesthesia Procedure Notes (Signed)
Procedure Name: Intubation Date/Time: 04/29/2017 1:53 PM Performed by: White, Amedeo Plenty, CRNA Pre-anesthesia Checklist: Patient identified, Emergency Drugs available, Suction available and Patient being monitored Patient Re-evaluated:Patient Re-evaluated prior to induction Oxygen Delivery Method: Circle System Utilized Preoxygenation: Pre-oxygenation with 100% oxygen Induction Type: IV induction Ventilation: Mask ventilation without difficulty Laryngoscope Size: Mac and 4 Grade View: Grade III Tube type: Oral Tube size: 7.5 mm Number of attempts: 1 Airway Equipment and Method: Stylet Placement Confirmation: ETT inserted through vocal cords under direct vision,  positive ETCO2 and breath sounds checked- equal and bilateral Secured at: 23 cm Tube secured with: Tape Dental Injury: Teeth and Oropharynx as per pre-operative assessment

## 2017-04-29 NOTE — Transfer of Care (Signed)
Immediate Anesthesia Transfer of Care Note  Patient: Jesse Munoz  Procedure(s) Performed: WOUND EXPLORATION (Left Knee)  Patient Location: PACU  Anesthesia Type:General  Level of Consciousness: awake, alert  and patient cooperative  Airway & Oxygen Therapy: Patient Spontanous Breathing and Patient connected to nasal cannula oxygen  Post-op Assessment: Report given to RN and Post -op Vital signs reviewed and stable  Post vital signs: Reviewed and stable  Last Vitals:  Vitals:   04/29/17 0824 04/29/17 1510  BP: (!) 141/92 (!) 141/98  Pulse:  86  Resp:  14  Temp:    SpO2:  100%    Last Pain:  Vitals:   04/29/17 1151  TempSrc:   PainSc: 10-Worst pain ever      Patients Stated Pain Goal: 6 (41/74/08 1448)  Complications: No apparent anesthesia complications

## 2017-04-29 NOTE — Discharge Instructions (Signed)

## 2017-04-29 NOTE — Anesthesia Preprocedure Evaluation (Addendum)
Anesthesia Evaluation  Patient identified by MRN, date of birth, ID band Patient awake    Reviewed: Allergy & Precautions, NPO status , Patient's Chart, lab work & pertinent test results  Airway Mallampati: II  TM Distance: >3 FB     Dental  (+) Poor Dentition, Chipped, Dental Advisory Given   Pulmonary Current Smoker,    breath sounds clear to auscultation       Cardiovascular hypertension, Pt. on medications  Rhythm:Regular Rate:Normal     Neuro/Psych PSYCHIATRIC DISORDERS Anxiety Bipolar Disorder Schizophrenia  Neuromuscular disease CVA    GI/Hepatic negative GI ROS, Neg liver ROS, (+) Cirrhosis   ascites  substance abuse  alcohol use, cocaine use, marijuana use and IV drug use, Hepatitis -, C  Endo/Other  negative endocrine ROS  Renal/GU negative Renal ROS  negative genitourinary   Musculoskeletal  (+) Arthritis , Osteoarthritis,  Hx/o AVN   Abdominal   Peds  Hematology  (+) HIV,   Anesthesia Other Findings   Reproductive/Obstetrics                           Anesthesia Physical Anesthesia Plan  ASA: III  Anesthesia Plan: General   Post-op Pain Management:    Induction: Intravenous  PONV Risk Score and Plan: 1 and Treatment may vary due to age or medical condition, Ondansetron, Dexamethasone and Promethazine  Airway Management Planned: Oral ETT  Additional Equipment:   Intra-op Plan:   Post-operative Plan: Extubation in OR  Informed Consent: I have reviewed the patients History and Physical, chart, labs and discussed the procedure including the risks, benefits and alternatives for the proposed anesthesia with the patient or authorized representative who has indicated his/her understanding and acceptance.   Dental advisory given  Plan Discussed with: CRNA and Anesthesiologist  Anesthesia Plan Comments:        Anesthesia Quick Evaluation
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
## 2017-04-29 NOTE — Anesthesia Postprocedure Evaluation (Signed)
Anesthesia Post Note  Patient: Jesse Munoz  Procedure(s) Performed: Anterior Incision , Abandoned total Knee Repacement (Left Knee)     Patient location during evaluation: PACU Anesthesia Type: General Level of consciousness: awake and alert, oriented and awake Vital Signs Assessment: post-procedure vital signs reviewed and stable Respiratory status: spontaneous breathing, nonlabored ventilation and respiratory function stable Cardiovascular status: blood pressure returned to baseline Anesthetic complications: no    Last Vitals:  Vitals:   04/29/17 1830 04/29/17 1845  BP:    Pulse: 79 86  Resp: 12 19  Temp:    SpO2: 96% 99%    Last Pain:  Vitals:   04/29/17 1800  TempSrc:   PainSc: 0-No pain                 [name]
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
## 2017-04-29 NOTE — Op Note (Signed)
DATE OF SURGERY:  04/29/2017 TIME: 1:46 PM  PATIENT NAME:  Jesse Munoz   AGE: 41 y.o.    PRE-OPERATIVE DIAGNOSIS: Osteoarthritis with bone infarct, avascular necrosis, left knee  POST-OPERATIVE DIAGNOSIS:  Same, new onset EKG changes intraoperatively with ST segment elevation  PROCEDURE:  Procedure(s): Incision anteriorly over the knee, we abandoned the total knee replacement before making an arthrotomy because of acute cardiac changes seen on anesthesia monitoring.   SURGEON:  Johnny Bridge, MD   ASSISTANT:  Joya Gaskins, OPA-C  OPERATIVE IMPLANTS: none   PREOPERATIVE INDICATIONS:  [name] is a 41 y.o. year old male with avascular necrosis with bone infarct, left knee who failed conservative treatment, including injections, antiinflammatories, activity modification, and assistive devices, and had significant impairment of their activities of daily living, and elected for Total Knee Arthroplasty.   The risks, benefits, and alternatives were discussed at length including but not limited to the risks of infection, bleeding, nerve injury, stiffness, blood clots, the need for revision surgery, cardiopulmonary complications, among others, and they were willing to proceed.  ESTIMATED BLOOD LOSS: minimal  OPERATIVE DESCRIPTION:  The patient was brought to the operative room and placed in a supine position.  Anesthesia was administered.  IV antibiotics were given both ancef and vanc.  His preop screen was negative for MRSA, but positive for staph, and he has a remote history of MRSA.  The lower extremity was prepped and draped in the usual sterile fashion.  Time out was performed.  The leg was elevated and exsanguinated and the tourniquet was inflated.  Made an anterior midline incision, and mobilized the soft tissue window cut but, but had not made an arthrotomy.  It was recognized by anesthesia that he was having "tombstone" changes in his ST segment elevation.  I paused with  proceeding with the arthrotomy, awaiting for definitive information on whether or not he was actually having a cardiac event.  The EKG was brought into the room, and a new EKG was taken, which demonstrated ST elevation which was concerning for possible acute MI, which was changed from his preoperative EKG.  Thus, decision was made to abandon the operation, we closed the subcutaneous tissue with Vicryl followed by staples for the skin.  A tourniquet was then released.  He was awakened and will return to the recovery room for cardiac workup and optimization.  Total tourniquet time was ~21 minutes.

## 2017-04-30 ENCOUNTER — Inpatient Hospital Stay (HOSPITAL_COMMUNITY): Payer: Medicaid Other

## 2017-04-30 ENCOUNTER — Encounter (HOSPITAL_COMMUNITY): Payer: Self-pay | Admitting: Orthopedic Surgery

## 2017-04-30 DIAGNOSIS — I361 Nonrheumatic tricuspid (valve) insufficiency: Secondary | ICD-10-CM

## 2017-04-30 DIAGNOSIS — R079 Chest pain, unspecified: Secondary | ICD-10-CM

## 2017-04-30 LAB — BASIC METABOLIC PANEL
ANION GAP: 8 (ref 5–15)
Anion gap: 9 (ref 5–15)
Anion gap: 10 (ref 5–15)
Anion gap: 7 (ref 5–15)
BUN: 18 mg/dL (ref 6–20)
BUN: 18 mg/dL (ref 6–20)
BUN: 18 mg/dL (ref 6–20)
BUN: 16 mg/dL (ref 6–20)
CALCIUM: 8.7 mg/dL — AB (ref 8.9–10.3)
CALCIUM: 8.8 mg/dL — AB (ref 8.9–10.3)
CALCIUM: 9.1 mg/dL (ref 8.9–10.3)
CHLORIDE: 97 mmol/L — AB (ref 101–111)
CO2: 29 mmol/L (ref 22–32)
CO2: 30 mmol/L (ref 22–32)
CO2: 30 mmol/L (ref 22–32)
CO2: 30 mmol/L (ref 22–32)
CREATININE: 1.16 mg/dL (ref 0.61–1.24)
CREATININE: 1.17 mg/dL (ref 0.61–1.24)
CREATININE: 1.05 mg/dL (ref 0.61–1.24)
Calcium: 8.8 mg/dL — ABNORMAL LOW (ref 8.9–10.3)
Chloride: 98 mmol/L — ABNORMAL LOW (ref 101–111)
Chloride: 98 mmol/L — ABNORMAL LOW (ref 101–111)
Chloride: 99 mmol/L — ABNORMAL LOW (ref 101–111)
Creatinine, Ser: 1.2 mg/dL (ref 0.61–1.24)
GFR calc Af Amer: >60 mL/min (ref >60)
GFR calc non Af Amer: >60 mL/min (ref >60)
GFR calc non Af Amer: >60 mL/min (ref >60)
GFR calc non Af Amer: >60 mL/min (ref >60)
GLUCOSE: 116 mg/dL — AB (ref 65–99)
Glucose, Bld: 116 mg/dL — ABNORMAL HIGH (ref 65–99)
Glucose, Bld: 90 mg/dL (ref 65–99)
Glucose, Bld: 104 mg/dL — ABNORMAL HIGH (ref 65–99)
POTASSIUM: 3.4 mmol/L — AB (ref 3.5–5.1)
Potassium: 3.2 mmol/L — ABNORMAL LOW (ref 3.5–5.1)
Potassium: 3.2 mmol/L — ABNORMAL LOW (ref 3.5–5.1)
Potassium: 3.6 mmol/L (ref 3.5–5.1)
SODIUM: 136 mmol/L (ref 135–145)
SODIUM: 137 mmol/L (ref 135–145)
SODIUM: 136 mmol/L (ref 135–145)
Sodium: 136 mmol/L (ref 135–145)

## 2017-04-30 LAB — ECHOCARDIOGRAM COMPLETE
Ao-asc: 28 cm
CHL CUP MV DEC (S): 246
CHL CUP TV REG PEAK VELOCITY: 295 cm/s
E/e' ratio: 7.19
EWDT: 246 ms
FS: 29 % (ref 28–44)
Height: 69.5 in
IV/PV OW: 1.1
LA diam index: 1.59 cm/m2
LA vol A4C: 70.7 ml
LASIZE: 33 mm
LDCA: 2.84 cm2
LEFT ATRIUM END SYS DIAM: 33 mm
LV E/e'average: 7.19
LV PW d: 10 mm — AB (ref 0.6–1.1)
LV TDI E'LATERAL: 10.6
LV TDI E'MEDIAL: 8.59
LV e' LATERAL: 10.6 cm/s
LVEEMED: 7.19
LVOTD: 19 mm
MV pk A vel: 83.4 m/s
MVAP: 3.06 cm2
MVPG: 2 mmHg
MVPKEVEL: 76.2 m/s
P 1/2 time: 72 ms
RV LATERAL S' VELOCITY: 12.3 cm/s
TAPSE: 22.3 mm
TR max vel: 295 cm/s
Weight: 3040 oz

## 2017-04-30 LAB — CBC
HCT: 37 % — ABNORMAL LOW (ref 39.0–52.0)
HEMATOCRIT: 36.3 % — AB (ref 39.0–52.0)
HEMOGLOBIN: 12 g/dL — AB (ref 13.0–17.0)
HEMOGLOBIN: 12 g/dL — AB (ref 13.0–17.0)
MCH: 27 pg (ref 26.0–34.0)
MCH: 26.5 pg (ref 26.0–34.0)
MCHC: 33.1 g/dL (ref 30.0–36.0)
MCHC: 32.4 g/dL (ref 30.0–36.0)
MCV: 81.6 fL (ref 78.0–100.0)
MCV: 81.7 fL (ref 78.0–100.0)
Platelets: 170 10*3/uL (ref 150–400)
Platelets: 173 10*3/uL (ref 150–400)
RBC: 4.45 MIL/uL (ref 4.22–5.81)
RBC: 4.53 MIL/uL (ref 4.22–5.81)
RDW: 13.6 % (ref 11.5–15.5)
RDW: 13.8 % (ref 11.5–15.5)
WBC: 8.5 10*3/uL (ref 4.0–10.5)
WBC: 7.8 10*3/uL (ref 4.0–10.5)

## 2017-04-30 LAB — TROPONIN I: Troponin I: <0.03 ng/mL (ref <0.03)

## 2017-04-30 LAB — MAGNESIUM: MAGNESIUM: 1.7 mg/dL (ref 1.7–2.4)

## 2017-04-30 MED ORDER — POTASSIUM CHLORIDE CRYS ER 20 MEQ PO TBCR
40.0000 meq | EXTENDED_RELEASE_TABLET | Freq: Once | ORAL | Status: AC
  Administered 2017-04-30: 40 meq via ORAL
  Filled 2017-04-30: qty 2

## 2017-04-30 MED ORDER — NITROGLYCERIN 0.4 MG SL SUBL
SUBLINGUAL_TABLET | SUBLINGUAL | Status: AC
  Filled 2017-04-30: qty 1

## 2017-04-30 MED ORDER — METOPROLOL TARTRATE 5 MG/5ML IV SOLN
INTRAVENOUS | Status: AC
  Filled 2017-04-30: qty 15

## 2017-04-30 MED ORDER — IOPAMIDOL (ISOVUE-370) INJECTION 76%
INTRAVENOUS | Status: AC
  Administered 2017-04-30: 80 mL via INTRAVENOUS
  Filled 2017-04-30: qty 100

## 2017-04-30 MED ORDER — IOPAMIDOL (ISOVUE-370) INJECTION 76%
INTRAVENOUS | Status: AC
  Filled 2017-04-30: qty 100

## 2017-04-30 MED ORDER — NICOTINE 14 MG/24HR TD PT24
14.0000 mg | MEDICATED_PATCH | Freq: Every day | TRANSDERMAL | Status: DC
  Administered 2017-04-30 – 2017-05-01 (×2): 14 mg via TRANSDERMAL
  Filled 2017-04-30 (×2): qty 1

## 2017-04-30 MED ORDER — MAGNESIUM SULFATE IN D5W 1-5 GM/100ML-% IV SOLN
1.0000 g | Freq: Once | INTRAVENOUS | Status: AC
  Administered 2017-04-30: 1 g via INTRAVENOUS
  Filled 2017-04-30: qty 100

## 2017-04-30 MED ORDER — SODIUM CHLORIDE 0.9 % IV SOLN
INTRAVENOUS | Status: AC
  Administered 2017-04-30: 10:00:00 via INTRAVENOUS

## 2017-04-30 NOTE — Progress Notes (Signed)
  Echocardiogram 2D Echocardiogram has been performed.  Jesse Munoz Jesse Munoz 04/30/2017, 9:45 AM

## 2017-04-30 NOTE — Progress Notes (Signed)
Patient ID: Jesse Munoz, male   DOB: [birth year], 41 y.o.   MRN: [id number]     Subjective:  Patient reports pain as mild.  Patient in bed and asking when he can get back on the schedule  Objective:   VITALS:   Vitals:   04/29/17 2300 04/30/17 0210 04/30/17 0653 04/30/17 0814  BP: 114/78  106/74 119/86  Pulse: 100  71 68  Resp: 13  17 12   Temp: 98.2 F (36.8 C)  98 F (36.7 C) 97.8 F (36.6 C)  TempSrc: Oral Oral Oral Oral  SpO2: 96%  95% 98%  Weight:   86.2 kg (190 lb)   Height:  5' 9.5" (1.765 m)      ABD soft Sensation intact distally Dorsiflexion/Plantar flexion intact Incision: dressing C/D/I and no drainage   Lab Results  Component Value Date   WBC 7.8 04/30/2017   HGB 12.0 (L) 04/30/2017   HCT 37.0 (L) 04/30/2017   MCV 81.7 04/30/2017   PLT 173 04/30/2017   BMET    Component Value Date/Time   NA 137 04/30/2017 0429   K 3.4 (L) 04/30/2017 0429   CL 97 (L) 04/30/2017 0429   CO2 30 04/30/2017 0429   GLUCOSE 90 04/30/2017 0429   BUN 18 04/30/2017 0429   CREATININE 1.20 04/30/2017 0429   CALCIUM 8.8 (L) 04/30/2017 0429   GFRNONAA >60 04/30/2017 0429   GFRAA >60 04/30/2017 0429     Assessment/Plan: 1 Day Post-Op   Principal Problem:   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 Continue NPO per cards WBAT Dry dressing PRN Okay to take down the Ace Wrap and re-wrap    Rande Brunt, BRANDON 04/30/2017, 8:43 AM  Patient seen and agree with above.  The  toxicology screen was ordered both by myself, and Dr. Linna Caprice, both of which were discontinued by epic and then the "duplicate" discontinued by laboratory, I have reordered the lab and talked with the nurse.  If his cardiac workup ends up negative, and his toxicology screen is appropriate, the earliest it looks like surgery could be done would be Tuesday.  Marchia Bond, MD Cell [phone]
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
## 2017-05-01 LAB — CBC
HEMATOCRIT: 42.2 % (ref 39.0–52.0)
Hemoglobin: 14.3 g/dL (ref 13.0–17.0)
MCH: 27.4 pg (ref 26.0–34.0)
MCHC: 33.9 g/dL (ref 30.0–36.0)
MCV: 80.8 fL (ref 78.0–100.0)
PLATELETS: 180 10*3/uL (ref 150–400)
RBC: 5.22 MIL/uL (ref 4.22–5.81)
RDW: 14.1 % (ref 11.5–15.5)
WBC: 13.7 10*3/uL — AB (ref 4.0–10.5)

## 2017-05-01 LAB — RAPID URINE DRUG SCREEN, HOSP PERFORMED
AMPHETAMINES: NOT DETECTED
BENZODIAZEPINES: POSITIVE — AB
Barbiturates: NOT DETECTED
Cocaine: POSITIVE — AB
Opiates: POSITIVE — AB
Tetrahydrocannabinol: NOT DETECTED
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
## 2017-05-01 NOTE — Plan of Care (Signed)
Pt voices understanding of unit routine, and is oriented to the unit and room.  Voices understanding of general hospital information and understands how to use phone and call light.  Plan of care also discussed.
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
## 2017-05-01 NOTE — Care Management Note (Signed)
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                     Action/Plan: Plan to be rescheduled by Orthopedics for his elective knee surgery for next week. DME: RW and 3n1 was ordered via [facility] and will be delivered to the patient's home- no charge for delivery and pt is aware. No further needs from CM at this time.     Expected Discharge Date:  05/01/17               Expected Discharge Plan:  Home/Self Care  In-House Referral:  NA  Discharge planning Services  CM Consult  Post Acute Care Choice:  Home Health, Durable Medical Equipment Choice offered to:  Patient  DME Arranged:  Shower stool, Walker rolling DME Agency:  [place]:  NA [place] Agency:  NA  Status of Service:  Completed, signed off  If discussed at [place] of Stay Meetings, dates discussed:    Additional Comments:  Bethena Roys, RN 05/01/2017, 1:57 PM

## 2017-05-01 NOTE — Progress Notes (Signed)
Patient ID: Jesse Munoz, male   DOB: June 02, 1976, 41 y.o.   MRN: [id number]     Subjective:  Patient reports pain as mild.  Patient in bed and in no acute distress denies any CP or SOB  Objective:   VITALS:   Vitals:   04/30/17 1137 04/30/17 1719 04/30/17 2045 05/01/17 0109  BP: 123/76 114/69 (!) 153/92 (!) 145/86  Pulse: 81 72 73 76  Resp: 15 (!) 24 (!) 21 19  Temp: 97.9 F (36.6 C) 98.3 F (36.8 C) 97.9 F (36.6 C) 98.3 F (36.8 C)  TempSrc: Oral Oral Oral Oral  SpO2: 96% 100% 100% 100%  Weight:      Height:        ABD soft Sensation intact distally Dorsiflexion/Plantar flexion intact Incision: dressing C/D/I and no drainage   Lab Results  Component Value Date   WBC 7.8 04/30/2017   HGB 12.0 (L) 04/30/2017   HCT 37.0 (L) 04/30/2017   MCV 81.7 04/30/2017   PLT 173 04/30/2017   BMET    Component Value Date/Time   NA 136 04/30/2017 1022   K 3.6 04/30/2017 1022   CL 99 (L) 04/30/2017 1022   CO2 30 04/30/2017 1022   GLUCOSE 104 (H) 04/30/2017 1022   BUN 16 04/30/2017 1022   CREATININE 1.05 04/30/2017 1022   CALCIUM 9.1 04/30/2017 1022   GFRNONAA >60 04/30/2017 1022   GFRAA >60 04/30/2017 1022     Assessment/Plan: 2 Days Post-Op   Principal Problem:   Avascular necrosis of medial condyle of left femur (HCC)   Advance diet Up with therapy Waiting on results of cardiac work up and drug work up  [employer] for OR on Tuesday  Will plan for DC home when all results are in   [place] 05/01/2017, 6:49 AM  Discussed and agree with above.  Marchia Bond, MD Cell [phone]
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
## 2017-05-06 ENCOUNTER — Inpatient Hospital Stay: Admit: 2017-05-06 | Payer: Medicaid Other | Admitting: Orthopedic Surgery

## 2017-05-06 SURGERY — ARTHROPLASTY, KNEE, TOTAL
Anesthesia: Choice | Laterality: Left

## 2017-05-07 LAB — BENZODIAZEPINES,MS,WB/SP RFX
7-Aminoclonazepam: NEGATIVE ng/mL
ALPRAZOLAM: NEGATIVE ng/mL
BENZODIAZEPINES CONFIRM: POSITIVE
CHLORDIAZEPOXIDE: NEGATIVE ng/mL
CLONAZEPAM: NEGATIVE ng/mL
DESMETHYLCHLORDIAZEPOXIDE: NEGATIVE ng/mL
DESMETHYLDIAZEPAM: NEGATIVE ng/mL
Desalkylflurazepam: NEGATIVE ng/mL
Diazepam: NEGATIVE ng/mL
FLURAZEPAM: NEGATIVE ng/mL
LORAZEPAM: NEGATIVE ng/mL
Midazolam: 6 ng/mL
OXAZEPAM: NEGATIVE ng/mL
Temazepam: NEGATIVE ng/mL
Triazolam: NEGATIVE ng/mL

## 2017-05-07 LAB — COCAINE,MS,WB/SP RFX
Benzoylecgonine: 120 ng/mL
COCAINE: NEGATIVE ng/mL
Cocaine Confirmation: POSITIVE

## 2017-05-12 LAB — DRUG SCREEN 10 W/CONF, SERUM
Amphetamines, IA: NEGATIVE ng/mL
BARBITURATES, IA: NEGATIVE ug/mL
Benzodiazepines, IA: POSITIVE ng/mL
Cocaine & Metabolite, IA: POSITIVE ng/mL
METHADONE, IA: POSITIVE ng/mL
OXYCODONES, IA: POSITIVE ng/mL
Opiates, IA: NEGATIVE ng/mL
PHENCYCLIDINE, IA: NEGATIVE ng/mL
PROPOXYPHENE, IA: NEGATIVE ng/mL
THC(MARIJUANA) METABOLITE, IA: NEGATIVE ng/mL

## 2017-05-12 LAB — METHADONE,MS,WB/SP RFX
METHADONE CONFIRMATION: POSITIVE
METHADONE: 277.5 ng/mL

## 2017-05-12 LAB — OXYCODONES,MS,WB/SP RFX
OXYCOCONE: 31.2 ng/mL
OXYCODONES CONFIRMATION: POSITIVE
OXYMORPHONE: NEGATIVE ng/mL

## 2017-07-03 ENCOUNTER — Telehealth: Payer: Self-pay | Admitting: *Deleted

## 2017-07-03 NOTE — Telephone Encounter (Signed)
   [facility] Pre-operative Risk Assessment    Request for surgical clearance:  1. What type of surgery is being performed? LTKR    2. When is this surgery scheduled? 08/19/17   3. Are there any medications that need to be held prior to surgery and how long?  4. Practice name and name of physician performing surgery? DR Roberts   5. What is your office phone and fax number? PHONE [phone] EXT [place]   6. Anesthesia type (None, local, MAC, general) ? UNKNOWN

## 2017-07-04 NOTE — Telephone Encounter (Signed)
   Primary Cardiologist: Dr. Percival Spanish   Chart reviewed as part of pre-operative protocol coverage. Request for clearance prior to TKR.   Per cart review, pt initially was scheduled to undergone TKR 04/29/17. Approximately 25 min after anesthesia induction and at the time of initial incision, ST elevations were noted in precordial leads V2-V5 without reciprocal changes. Surgery was aborted and cardiology was consulted. He was seen by Dr. Michelle Piper in consultation. Cardiac enzymes were negative. Coronary CTA ordered to exclude CAD. Calcium score was low (although still at the [place] percentile for his age.)  No obstructive CAD. EF normal. It was felt that he could have had coronary spasms with anesthesia. However it was not documented if pt could later undergo TKR or if all elective surgeries should be avoided for this reason. I will route to Dr. Percival Spanish to decided if this pt can be cleared for TKR.    Lyda Jester, PA-C  07/04/2017, 2:24 PM

## 2017-07-06 NOTE — Telephone Encounter (Signed)
The patient had no obstructive CAD.  However, I could not exclude the possibility of coronary spasm if the same procedure is attempted.  It appears that the urine cocaine screen was positive although I am somewhat confused on the report.  If there was positive cocaine this very well could have contributed to the presumed coronary spasm and I would suggest that surgical risk would be prohibitive if this was an ongoing concern.

## 2017-07-08 NOTE — Telephone Encounter (Signed)
   Primary Cardiologist: Minus Breeding, MD  Chart reviewed as part of pre-operative protocol coverage.   Given past medical history and time since last visit, based on ACC/AHA guidelines, Jesse Munoz would be at risk for coronary vasospasm during the planned procedure. He had this after anesthesia induction 04/29/2017.    There is no way to tell if this will happen again.  There is no need for further cardiovascular testing.  I will route this recommendation to the requesting party via Epic fax function and remove from pre-op pool.  Please call with questions.  Rosaria Ferries, PA-C 07/08/2017, 2:39 PM

## 2017-08-07 NOTE — Progress Notes (Signed)
PCP: Clent Demark, PA-C  Cardiologist: Minus Breeding, MD  EKG: 04/29/17 in EPIC  Stress test:  ECHO: 04/30/17 in EPIC  Cardiac Cath:  Chest x-ray

## 2017-08-07 NOTE — Pre-Procedure Instructions (Signed)
Burnice Oestreicher  08/07/2017      [id number] - [place], [place] - [address]. AT [address]. [place] [address] Phone: [phone] Fax: [phone]    Your procedure is scheduled on August 19, 2017.  Report to [facility] Admitting at [phone] AM.  Call this number if you have problems the morning of surgery:  [phone]   Remember:  Do not eat food or drink liquids after midnight.  Take these medicines the morning of surgery with A SIP OF WATER citalopram (celexa), Genvoya,  Gabapentin (neurontin), methadone (dolophine), oxycodone-acetaminophen (percocet)-if needed for pain, promethazine (phenergan)-if needed for nausea).  7 days prior to surgery STOP taking any Aspirin (unless otherwise instructed by your surgeon), Aleve, Naproxen, Ibuprofen, Motrin, Advil, Goody's, BC's, all herbal medications, fish oil, and all vitamins  Continue all other medications as instructed by your physician except follow the above medication instructions before surgery   Do not wear jewelry.  Do not wear lotions, powders, or colognes, or deodorant.  Men may shave face and neck.  Do not bring valuables to the hospital.  [facility] is not responsible for any belongings or valuables.  Contacts, dentures or bridgework may not be worn into surgery.  Leave your suitcase in the car.  After surgery it may be brought to your room.  For patients admitted to the hospital, discharge time will be determined by your treatment team.  Patients discharged the day of surgery will not be allowed to drive home.   Special instructions:   [place]- Preparing For Surgery  Before surgery, you can play an important role. Because skin is not sterile, your skin needs to be as free of germs as possible. You can reduce the number of germs on your skin by washing with CHG (chlorahexidine gluconate) Soap before surgery.  CHG is an antiseptic cleaner which  kills germs and bonds with the skin to continue killing germs even after washing.  Please do not use if you have an allergy to CHG or antibacterial soaps. If your skin becomes reddened/irritated stop using the CHG.  Do not shave (including legs and underarms) for at least 48 hours prior to first CHG shower. It is OK to shave your face.  Please follow these instructions carefully.   1. Shower the NIGHT BEFORE SURGERY and the MORNING OF SURGERY with CHG.   2. If you chose to wash your hair, wash your hair first as usual with your normal shampoo.  3. After you shampoo, rinse your hair and body thoroughly to remove the shampoo.  4. Use CHG as you would any other liquid soap. You can apply CHG directly to the skin and wash gently with a scrungie or a clean washcloth.   5. Apply the CHG Soap to your body ONLY FROM THE NECK DOWN.  Do not use on open wounds or open sores. Avoid contact with your eyes, ears, mouth and genitals (private parts). Wash Face and genitals (private parts)  with your normal soap.  6. Wash thoroughly, paying special attention to the area where your surgery will be performed.  7. Thoroughly rinse your body with warm water from the neck down.  8. DO NOT shower/wash with your normal soap after using and rinsing off the CHG Soap.  9. Pat yourself dry with a CLEAN TOWEL.  10. Wear CLEAN PAJAMAS to bed the night before surgery, wear comfortable clothes the morning of surgery  11. Place CLEAN  SHEETS on your bed the night of your first shower and DO NOT SLEEP WITH PETS.  Day of Surgery: Do not apply any deodorants/lotions. Please wear clean clothes to the hospital/surgery center.    Please read over the following fact sheets that you were given. Pain Booklet, Coughing and Deep Breathing, MRSA Information and Surgical Site Infection Prevention

## 2017-08-08 ENCOUNTER — Inpatient Hospital Stay (HOSPITAL_COMMUNITY)
Admission: RE | Admit: 2017-08-08 | Discharge: 2017-08-08 | Disposition: A | Discharge status: Home / Self Care | Payer: Medicaid Other | Source: Ambulatory Visit

## 2017-09-22 ENCOUNTER — Other Ambulatory Visit (INDEPENDENT_AMBULATORY_CARE_PROVIDER_SITE_OTHER): Payer: Self-pay | Admitting: Physician Assistant

## 2017-09-22 DIAGNOSIS — B20 Human immunodeficiency virus [HIV] disease: Secondary | ICD-10-CM

## 2017-09-22 NOTE — Telephone Encounter (Signed)
FWD to PCP. Aveen Stansel S Hoke Baer, CMA  

## 2017-09-23 ENCOUNTER — Other Ambulatory Visit (INDEPENDENT_AMBULATORY_CARE_PROVIDER_SITE_OTHER): Payer: Self-pay | Admitting: Physician Assistant

## 2017-09-23 DIAGNOSIS — M25562 Pain in left knee: Secondary | ICD-10-CM

## 2017-09-23 DIAGNOSIS — M25561 Pain in right knee: Secondary | ICD-10-CM

## 2017-09-23 DIAGNOSIS — G8929 Other chronic pain: Secondary | ICD-10-CM

## 2017-09-23 DIAGNOSIS — B20 Human immunodeficiency virus [HIV] disease: Secondary | ICD-10-CM

## 2017-09-23 NOTE — Telephone Encounter (Signed)
I know that the genvoya has already been refused, but this particular request also has gabapentin attached to it, that's why I am forwarding to you. Nat Christen, CMA

## 2017-09-24 ENCOUNTER — Other Ambulatory Visit: Payer: Self-pay | Admitting: Pharmacist Clinician (PhC)/ Clinical Pharmacy Specialist

## 2017-09-24 ENCOUNTER — Other Ambulatory Visit: Payer: Medicaid Other

## 2017-09-24 ENCOUNTER — Ambulatory Visit (INDEPENDENT_AMBULATORY_CARE_PROVIDER_SITE_OTHER): Payer: Medicaid Other | Admitting: Pharmacist Clinician (PhC)/ Clinical Pharmacy Specialist

## 2017-09-24 ENCOUNTER — Ambulatory Visit: Payer: Medicaid Other

## 2017-09-24 ENCOUNTER — Other Ambulatory Visit (HOSPITAL_COMMUNITY)
Admission: RE | Admit: 2017-09-24 | Discharge: 2017-09-24 | Disposition: A | Discharge status: Home / Self Care | Payer: Medicaid Other | Source: Ambulatory Visit | Attending: Infectious Diseases | Admitting: Infectious Diseases

## 2017-09-24 DIAGNOSIS — Z79899 Other long term (current) drug therapy: Secondary | ICD-10-CM

## 2017-09-24 DIAGNOSIS — G8929 Other chronic pain: Secondary | ICD-10-CM

## 2017-09-24 DIAGNOSIS — M25562 Pain in left knee: Secondary | ICD-10-CM | POA: Diagnosis not present

## 2017-09-24 DIAGNOSIS — F419 Anxiety disorder, unspecified: Secondary | ICD-10-CM

## 2017-09-24 DIAGNOSIS — B20 Human immunodeficiency virus [HIV] disease: Secondary | ICD-10-CM

## 2017-09-24 DIAGNOSIS — F4310 Post-traumatic stress disorder, unspecified: Secondary | ICD-10-CM

## 2017-09-24 DIAGNOSIS — F209 Schizophrenia, unspecified: Secondary | ICD-10-CM | POA: Diagnosis not present

## 2017-09-24 DIAGNOSIS — Z113 Encounter for screening for infections with a predominantly sexual mode of transmission: Secondary | ICD-10-CM

## 2017-09-24 DIAGNOSIS — M25561 Pain in right knee: Secondary | ICD-10-CM

## 2017-09-24 MED ORDER — CITALOPRAM HYDROBROMIDE 10 MG PO TABS: 20.0000 mg | ORAL_TABLET | Freq: Every day | ORAL | 1 refills | Status: DC

## 2017-09-24 MED ORDER — GABAPENTIN 300 MG PO CAPS: 300.0000 mg | ORAL_CAPSULE | Freq: Three times a day (TID) | ORAL | 1 refills | Status: DC

## 2017-09-24 MED ORDER — BICTEGRAVIR-EMTRICITAB-TENOFOV 50-200-25 MG PO TABS: 1.0000 | ORAL_TABLET | Freq: Every day | ORAL | 1 refills | Status: DC

## 2017-09-24 MED ORDER — QUETIAPINE FUMARATE 50 MG PO TABS: 50.0000 mg | ORAL_TABLET | Freq: Every day | ORAL | 1 refills | Status: DC

## 2017-09-24 MED ORDER — CITALOPRAM HYDROBROMIDE 20 MG PO TABS: 20.0000 mg | ORAL_TABLET | Freq: Every day | ORAL | 1 refills | Status: DC

## 2017-09-24 MED FILL — CITALOPRAM HBR 20 MG TABLET: 20 | 30 days supply | Qty: 30 | Fill #0

## 2017-09-24 MED FILL — QUETIAPINE FUMARATE 50 MG T: 50 | 30 days supply | Qty: 30 | Fill #0

## 2017-09-24 MED FILL — BIKTARVY 50-200-25 MG TABS: 50-200-25 | 30 days supply | Qty: 30 | Fill #0

## 2017-09-24 MED FILL — GABAPENTIN 300 MG CAPSULE: 300 | 30 days supply | Qty: 90 | Fill #0
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
## 2017-09-25 LAB — URINALYSIS
Bilirubin Urine: NEGATIVE
Glucose, UA: NEGATIVE
HGB URINE DIPSTICK: NEGATIVE
Ketones, ur: NEGATIVE
LEUKOCYTES UA: NEGATIVE
NITRITE: NEGATIVE
PH: 6.5 (ref 5.0–8.0)
Protein, ur: NEGATIVE
SPECIFIC GRAVITY, URINE: 1.008 (ref 1.001–1.03)

## 2017-09-25 LAB — QUANTIFERON-TB GOLD PLUS
NIL: 0.12 [IU]/mL
QuantiFERON-TB Gold Plus: NEGATIVE
TB1-NIL: 0 IU/mL
TB2-NIL: 0.25 [IU]/mL

## 2017-09-25 LAB — T-HELPER CELL (CD4) - (RCID CLINIC ONLY)
CD4 % Helper T Cell: 23 % — ABNORMAL LOW (ref 33–55)
CD4 T CELL ABS: 870 /uL (ref 400–2700)

## 2017-09-25 LAB — URINE CYTOLOGY ANCILLARY ONLY
Chlamydia: NEGATIVE
Neisseria Gonorrhea: NEGATIVE

## 2017-09-30 LAB — HIV-1 RNA ULTRAQUANT REFLEX TO GENTYP+
HIV 1 RNA Quant: <20 Copies/mL — ABNORMAL HIGH
HIV-1 RNA Quant, Log: <1.3 Log cps/mL — ABNORMAL HIGH

## 2017-10-01 ENCOUNTER — Other Ambulatory Visit: Payer: Self-pay | Admitting: Orthopedic Surgery

## 2017-10-01 LAB — COMPLETE METABOLIC PANEL WITH GFR
AG Ratio: 1.2 (calc) (ref 1.0–2.5)
ALT: 10 U/L (ref 9–46)
AST: 24 U/L (ref 10–40)
Albumin: 4.3 g/dL (ref 3.6–5.1)
Alkaline phosphatase (APISO): 84 U/L (ref 40–115)
BUN: 10 mg/dL (ref 7–25)
CALCIUM: 10.1 mg/dL (ref 8.6–10.3)
CO2: 30 mmol/L (ref 20–32)
CREATININE: 0.89 mg/dL (ref 0.60–1.35)
Chloride: 98 mmol/L (ref 98–110)
GFR, EST NON AFRICAN AMERICAN: 104 mL/min/{1.73_m2} (ref >=60)
GFR, Est African American: 121 mL/min/{1.73_m2} (ref >=60)
GLUCOSE: 100 mg/dL — AB (ref 65–99)
Globulin: 3.6 g/dL (calc) (ref 1.9–3.7)
POTASSIUM: 3.6 mmol/L (ref 3.5–5.3)
Sodium: 137 mmol/L (ref 135–146)
Total Bilirubin: 0.4 mg/dL (ref 0.2–1.2)
Total Protein: 7.9 g/dL (ref 6.1–8.1)

## 2017-10-01 LAB — CBC WITH DIFFERENTIAL/PLATELET
Basophils Absolute: 55 cells/uL (ref 0–200)
Basophils Relative: 0.6 %
EOS PCT: 1.5 %
Eosinophils Absolute: 137 cells/uL (ref 15–500)
HCT: 43.2 % (ref 38.5–50.0)
Hemoglobin: 14.7 g/dL (ref 13.2–17.1)
Lymphs Abs: 3558 cells/uL (ref 850–3900)
MCH: 26.3 pg — ABNORMAL LOW (ref 27.0–33.0)
MCHC: 34 g/dL (ref 32.0–36.0)
MCV: 77.1 fL — ABNORMAL LOW (ref 80.0–100.0)
MPV: 10.7 fL (ref 7.5–12.5)
Monocytes Relative: 8.4 %
NEUTROS PCT: 50.4 %
Neutro Abs: 4586 cells/uL (ref 1500–7800)
PLATELETS: 202 10*3/uL (ref 140–400)
RBC: 5.6 10*6/uL (ref 4.20–5.80)
RDW: 14.9 % (ref 11.0–15.0)
TOTAL LYMPHOCYTE: 39.1 %
WBC mixed population: 764 cells/uL (ref 200–950)
WBC: 9.1 10*3/uL (ref 3.8–10.8)

## 2017-10-01 LAB — LIPID PANEL
Cholesterol: 238 mg/dL — ABNORMAL HIGH (ref <200)
HDL: 50 mg/dL (ref >40)
LDL Cholesterol (Calc): 156 mg/dL (calc) — ABNORMAL HIGH
NON-HDL CHOLESTEROL (CALC): 188 mg/dL — AB (ref <130)
TRIGLYCERIDES: 183 mg/dL — AB (ref <150)
Total CHOL/HDL Ratio: 4.8 (calc) (ref <5.0)

## 2017-10-01 LAB — RPR: RPR Ser Ql: NONREACTIVE

## 2017-10-01 LAB — HEPATITIS C ANTIBODY
HEP C AB: REACTIVE — AB
SIGNAL TO CUT-OFF: 28.3 — AB (ref <1.00)

## 2017-10-01 LAB — HCV RNA,QUANTITATIVE REAL TIME PCR
HCV Quantitative Log: <1.18 Log IU/mL
HCV RNA, PCR, QN: <15 IU/mL

## 2017-10-01 LAB — HEPATITIS B SURFACE ANTIBODY,QUALITATIVE: HEP B S AB: NONREACTIVE

## 2017-10-01 LAB — HEPATITIS A ANTIBODY, TOTAL: Hepatitis A AB,Total: NONREACTIVE

## 2017-10-01 LAB — HLA B*5701: HLA-B*5701 w/rflx HLA-B High: NEGATIVE

## 2017-10-01 LAB — HEPATITIS B SURFACE ANTIGEN: HEP B S AG: NONREACTIVE

## 2017-10-01 LAB — HEPATITIS B CORE ANTIBODY, TOTAL: Hep B Core Total Ab: REACTIVE — AB

## 2017-10-01 LAB — HIV ANTIBODY (ROUTINE TESTING W REFLEX): HIV 1&2 Ab, 4th Generation: REACTIVE — AB

## 2017-10-01 LAB — HIV-1/2 AB - DIFFERENTIATION
HIV 2 AB: NEGATIVE
HIV-1 antibody: POSITIVE — AB

## 2017-10-02 NOTE — Pre-Procedure Instructions (Signed)
Carrol Bondar  10/02/2017      [id number] [place], [place] - [address]. AT [address]. [place] [address] Phone: [phone] Fax: [phone]  [place], [place] [address] [address] Phone: [phone] Fax: [phone]    Your procedure is scheduled on April 30  Report to [facility] Admitting at 0900 A.M.  Call this number if you have problems the morning of surgery:  [phone]   Remember:  Do not eat food or drink liquids after midnight.   Take these medicines the morning of surgery with A SIP OF WATER  bictegravir-emtricitabine-tenofovir AF (BIKTARVY) citalopram (CELEXA) gabapentin (NEURONTIN)  methadone (DOLOPHINE)  Oxycodone  oxyCODONE-acetaminophen (PERCOCET)  7 days prior to surgery STOP taking any Aspirin(unless otherwise instructed by your surgeon), Aleve, Naproxen, Ibuprofen, Motrin, Advil, Goody's, BC's, all herbal medications, fish oil, and all vitamins    Do not wear jewelry  Do not wear lotions, powders, or cologne, or deodorant.  Men may shave face and neck.  Do not bring valuables to the hospital.  [facility] is not responsible for any belongings or valuables.  Contacts, dentures or bridgework may not be worn into surgery.  Leave your suitcase in the car.  After surgery it may be brought to your room.  For patients admitted to the hospital, discharge time will be determined by your treatment team.  Patients discharged the day of surgery will not be allowed to drive home.    Special instructions:   [place]- Preparing For Surgery  Before surgery, you can play an important role. Because skin is not sterile, your skin needs to be as free of germs as possible. You can reduce the number of germs on your skin by washing with CHG (chlorahexidine gluconate) Soap before surgery.  CHG is an antiseptic  cleaner which kills germs and bonds with the skin to continue killing germs even after washing.  Please do not use if you have an allergy to CHG or antibacterial soaps. If your skin becomes reddened/irritated stop using the CHG.  Do not shave (including legs and underarms) for at least 48 hours prior to first CHG shower. It is OK to shave your face.  Please follow these instructions carefully.   1. Shower the NIGHT BEFORE SURGERY and the MORNING OF SURGERY with CHG.   2. If you chose to wash your hair, wash your hair first as usual with your normal shampoo.  3. After you shampoo, rinse your hair and body thoroughly to remove the shampoo.  4. Use CHG as you would any other liquid soap. You can apply CHG directly to the skin and wash gently with a scrungie or a clean washcloth.   5. Apply the CHG Soap to your body ONLY FROM THE NECK DOWN.  Do not use on open wounds or open sores. Avoid contact with your eyes, ears, mouth and genitals (private parts). Wash Face and genitals (private parts)  with your normal soap.  6. Wash thoroughly, paying special attention to the area where your surgery will be performed.  7. Thoroughly rinse your body with warm water from the neck down.  8. DO NOT shower/wash with your normal soap after using and rinsing off the CHG Soap.  9. Pat yourself dry with a CLEAN TOWEL.  10. Wear CLEAN PAJAMAS to bed the night before surgery, wear comfortable clothes the morning of  surgery  11. Place CLEAN SHEETS on your bed the night of your first shower and DO NOT SLEEP WITH PETS.    Day of Surgery: Do not apply any deodorants/lotions. Please wear clean clothes to the hospital/surgery center.      Please read over the following fact sheets that you were given.

## 2017-10-03 ENCOUNTER — Encounter (HOSPITAL_COMMUNITY)
Admission: RE | Admit: 2017-10-03 | Discharge: 2017-10-03 | Disposition: A | Discharge status: Home / Self Care | Payer: Medicaid Other | Source: Ambulatory Visit | Attending: Orthopedic Surgery | Admitting: Orthopedic Surgery

## 2017-10-03 ENCOUNTER — Encounter (HOSPITAL_COMMUNITY): Payer: Self-pay

## 2017-10-03 ENCOUNTER — Other Ambulatory Visit: Payer: Self-pay

## 2017-10-03 DIAGNOSIS — Z01812 Encounter for preprocedural laboratory examination: Secondary | ICD-10-CM | POA: Insufficient documentation

## 2017-10-03 HISTORY — DX: Unilateral primary osteoarthritis, left knee: M17.12

## 2017-10-03 LAB — CBC
HCT: 39.9 % (ref 39.0–52.0)
HEMOGLOBIN: 13.3 g/dL (ref 13.0–17.0)
MCH: 26.4 pg (ref 26.0–34.0)
MCHC: 33.3 g/dL (ref 30.0–36.0)
MCV: 79.3 fL (ref 78.0–100.0)
Platelets: 184 10*3/uL (ref 150–400)
RBC: 5.03 MIL/uL (ref 4.22–5.81)
RDW: 15.2 % (ref 11.5–15.5)
WBC: 12 10*3/uL — ABNORMAL HIGH (ref 4.0–10.5)

## 2017-10-03 LAB — SURGICAL PCR SCREEN
MRSA, PCR: NEGATIVE
Staphylococcus aureus: POSITIVE — AB

## 2017-10-03 LAB — COMPREHENSIVE METABOLIC PANEL
ALK PHOS: 86 U/L (ref 38–126)
ALT: 16 U/L — AB (ref 17–63)
AST: 36 U/L (ref 15–41)
Albumin: 4.5 g/dL (ref 3.5–5.0)
Anion gap: 11 (ref 5–15)
BUN: 19 mg/dL (ref 6–20)
CALCIUM: 9.9 mg/dL (ref 8.9–10.3)
CO2: 25 mmol/L (ref 22–32)
CREATININE: 1.17 mg/dL (ref 0.61–1.24)
Chloride: 101 mmol/L (ref 101–111)
GFR calc non Af Amer: >60 mL/min (ref >60)
Glucose, Bld: 83 mg/dL (ref 65–99)
Potassium: 4.1 mmol/L (ref 3.5–5.1)
SODIUM: 137 mmol/L (ref 135–145)
Total Bilirubin: 0.9 mg/dL (ref 0.3–1.2)
Total Protein: 9.2 g/dL — ABNORMAL HIGH (ref 6.5–8.1)

## 2017-10-03 LAB — RAPID URINE DRUG SCREEN, HOSP PERFORMED
Amphetamines: NOT DETECTED
BARBITURATES: NOT DETECTED
Benzodiazepines: NOT DETECTED
COCAINE: NOT DETECTED
Opiates: NOT DETECTED
Tetrahydrocannabinol: NOT DETECTED

## 2017-10-03 NOTE — Progress Notes (Signed)
PCP -  [facility]   Cardiologist - Denies  Chest x-ray - Denies  EKG - 04/29/17 (E)  Stress Test - Denies  ECHO - Denies  Cardiac Cath - Denies  Sleep Study - Denies CPAP - None  LABS-10/03/17: CBC, CMP, U-DRUG  Anesthesia- No  Pt denies having chest pain, sob, or fever at this time. All instructions explained to the pt, with a verbal understanding of the material. Pt agrees to go over the instructions while at home for a better understanding. The opportunity to ask questions was provided.
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
## 2017-10-08 ENCOUNTER — Ambulatory Visit: Payer: Medicaid Other

## 2017-10-08 ENCOUNTER — Encounter: Payer: Self-pay | Admitting: Family

## 2017-10-08 ENCOUNTER — Ambulatory Visit (INDEPENDENT_AMBULATORY_CARE_PROVIDER_SITE_OTHER): Payer: Medicaid Other | Admitting: Family

## 2017-10-08 VITALS — BP 162/100 | HR 94 | Temp 97.8°F | Ht 69.5 in | Wt 188.0 lb

## 2017-10-08 DIAGNOSIS — G8929 Other chronic pain: Secondary | ICD-10-CM

## 2017-10-08 DIAGNOSIS — M25561 Pain in right knee: Secondary | ICD-10-CM | POA: Diagnosis not present

## 2017-10-08 DIAGNOSIS — B20 Human immunodeficiency virus [HIV] disease: Secondary | ICD-10-CM

## 2017-10-08 DIAGNOSIS — M25562 Pain in left knee: Secondary | ICD-10-CM

## 2017-10-08 MED ORDER — TRAMADOL HCL 50 MG PO TABS: 50.0000 mg | ORAL_TABLET | Freq: Four times a day (QID) | ORAL | 0 refills | Status: DC | PRN

## 2017-10-08 NOTE — Assessment & Plan Note (Signed)
Chronic left knee pain scheduled to undergo total knee replacement next week by Dr. Mardelle Matte. He does not have a primary care provider currently. I will give him a one time fill of Tramadol prior to surgery. [place] Controlled Substance Database reviewed with no irregularities. Continue follow up with Dr. Mardelle Matte.

## 2017-10-08 NOTE — Assessment & Plan Note (Addendum)
HIV appears well controlled with most recent blood work with a viral load that was undetectable and CD4 count of 870. Tolerating the [place] with no adverse side effects. Agree with change to [facility] as this will have a higher barrier of resistance. He has received Prevnar and due for Pneumovax, however patient wishes to wait pending knee surgery next week. Plan to continue [place] with follow up with pharmacy in 1 month and follow up with me in 3 months.

## 2017-10-08 NOTE — Patient Instructions (Addendum)
Nice to meet you!   Plan to follow up in a month.   Please continue to take the [place].  Plan to follow up with Minh in 1 month.  Follow up with me in 3 months.   Your labs are attached.   Good luck with surgery.

## 2017-10-14 ENCOUNTER — Inpatient Hospital Stay (HOSPITAL_COMMUNITY): Payer: Medicaid Other

## 2017-10-14 ENCOUNTER — Inpatient Hospital Stay (HOSPITAL_COMMUNITY): Payer: Medicaid Other | Admitting: Certified Registered Nurse Anesthetist

## 2017-10-14 ENCOUNTER — Encounter (HOSPITAL_COMMUNITY): Payer: Self-pay | Admitting: *Deleted

## 2017-10-14 ENCOUNTER — Inpatient Hospital Stay (HOSPITAL_COMMUNITY)
Admission: RE | Admit: 2017-10-14 | Discharge: 2017-10-16 | DRG: 470 | Disposition: A | Discharge status: Home Health | Payer: Medicaid Other | Source: Ambulatory Visit | Attending: Orthopedic Surgery | Admitting: Orthopedic Surgery

## 2017-10-14 ENCOUNTER — Encounter (HOSPITAL_COMMUNITY)
Admission: RE | Disposition: A | Discharge status: Home Health | Payer: Self-pay | Source: Ambulatory Visit | Attending: Orthopedic Surgery

## 2017-10-14 ENCOUNTER — Inpatient Hospital Stay (HOSPITAL_COMMUNITY): Payer: Medicaid Other | Admitting: Vascular Surgery

## 2017-10-14 DIAGNOSIS — Z21 Asymptomatic human immunodeficiency virus [HIV] infection status: Secondary | ICD-10-CM | POA: Diagnosis present

## 2017-10-14 DIAGNOSIS — F17210 Nicotine dependence, cigarettes, uncomplicated: Secondary | ICD-10-CM | POA: Diagnosis present

## 2017-10-14 DIAGNOSIS — Z981 Arthrodesis status: Secondary | ICD-10-CM | POA: Diagnosis not present

## 2017-10-14 DIAGNOSIS — M879 Osteonecrosis, unspecified: Secondary | ICD-10-CM | POA: Diagnosis present

## 2017-10-14 DIAGNOSIS — F209 Schizophrenia, unspecified: Secondary | ICD-10-CM | POA: Diagnosis present

## 2017-10-14 DIAGNOSIS — E669 Obesity, unspecified: Secondary | ICD-10-CM | POA: Diagnosis present

## 2017-10-14 DIAGNOSIS — M1712 Unilateral primary osteoarthritis, left knee: Secondary | ICD-10-CM | POA: Diagnosis present

## 2017-10-14 DIAGNOSIS — I10 Essential (primary) hypertension: Secondary | ICD-10-CM | POA: Diagnosis present

## 2017-10-14 DIAGNOSIS — G8929 Other chronic pain: Secondary | ICD-10-CM | POA: Diagnosis present

## 2017-10-14 DIAGNOSIS — Z8673 Personal history of transient ischemic attack (TIA), and cerebral infarction without residual deficits: Secondary | ICD-10-CM

## 2017-10-14 DIAGNOSIS — Z880 Allergy status to penicillin: Secondary | ICD-10-CM

## 2017-10-14 DIAGNOSIS — Z96652 Presence of left artificial knee joint: Secondary | ICD-10-CM

## 2017-10-14 DIAGNOSIS — Z87820 Personal history of traumatic brain injury: Secondary | ICD-10-CM

## 2017-10-14 DIAGNOSIS — Z79891 Long term (current) use of opiate analgesic: Secondary | ICD-10-CM

## 2017-10-14 HISTORY — DX: Unilateral primary osteoarthritis, left knee: M17.12

## 2017-10-14 HISTORY — PX: TOTAL KNEE ARTHROPLASTY: SHX125

## 2017-10-14 LAB — RAPID URINE DRUG SCREEN, HOSP PERFORMED
AMPHETAMINES: NOT DETECTED
BENZODIAZEPINES: NOT DETECTED
Barbiturates: NOT DETECTED
Cocaine: NOT DETECTED
OPIATES: NOT DETECTED
TETRAHYDROCANNABINOL: NOT DETECTED

## 2017-10-14 SURGERY — ARTHROPLASTY, KNEE, TOTAL
Anesthesia: General | Site: Knee | Laterality: Left

## 2017-10-14 MED ORDER — HYDROMORPHONE HCL 2 MG/ML IJ SOLN
0.5000 mg | INTRAMUSCULAR | Status: DC | PRN
  Administered 2017-10-14 – 2017-10-16 (×6): 1 mg via INTRAVENOUS
  Filled 2017-10-14 (×6): qty 1

## 2017-10-14 MED ORDER — MEPERIDINE HCL 50 MG/ML IJ SOLN: 6.2500 mg | INTRAMUSCULAR | Status: DC | PRN

## 2017-10-14 MED ORDER — METHOCARBAMOL 500 MG PO TABS
500.0000 mg | ORAL_TABLET | Freq: Four times a day (QID) | ORAL | Status: DC | PRN
  Administered 2017-10-14 – 2017-10-16 (×3): 500 mg via ORAL
  Filled 2017-10-14 (×4): qty 1

## 2017-10-14 MED ORDER — VITAMIN C 500 MG PO TABS
500.0000 mg | ORAL_TABLET | Freq: Every day | ORAL | Status: DC
  Administered 2017-10-14 – 2017-10-16 (×3): 500 mg via ORAL
  Filled 2017-10-14 (×3): qty 1

## 2017-10-14 MED ORDER — LISINOPRIL 40 MG PO TABS
40.0000 mg | ORAL_TABLET | Freq: Every day | ORAL | Status: DC
  Administered 2017-10-14 – 2017-10-16 (×3): 40 mg via ORAL
  Filled 2017-10-14 (×3): qty 1

## 2017-10-14 MED ORDER — OXYCODONE HCL ER 10 MG PO T12A
10.0000 mg | EXTENDED_RELEASE_TABLET | Freq: Two times a day (BID) | ORAL | Status: DC
  Administered 2017-10-14 – 2017-10-16 (×4): 10 mg via ORAL
  Filled 2017-10-14 (×4): qty 1

## 2017-10-14 MED ORDER — DEXAMETHASONE SODIUM PHOSPHATE 10 MG/ML IJ SOLN
10.0000 mg | Freq: Once | INTRAMUSCULAR | Status: AC
  Administered 2017-10-15: 10 mg via INTRAVENOUS
  Filled 2017-10-14: qty 1

## 2017-10-14 MED ORDER — METOCLOPRAMIDE HCL 5 MG PO TABS: 5.0000 mg | ORAL_TABLET | Freq: Three times a day (TID) | ORAL | Status: DC | PRN

## 2017-10-14 MED ORDER — KETOROLAC TROMETHAMINE 30 MG/ML IJ SOLN
INTRAMUSCULAR | Status: DC | PRN
  Administered 2017-10-14: 30 mg

## 2017-10-14 MED ORDER — POTASSIUM CHLORIDE IN NACL 20-0.45 MEQ/L-% IV SOLN
INTRAVENOUS | Status: DC
  Administered 2017-10-14 – 2017-10-15 (×2): via INTRAVENOUS
  Filled 2017-10-14 (×3): qty 1000

## 2017-10-14 MED ORDER — CHLORTHALIDONE 50 MG PO TABS
50.0000 mg | ORAL_TABLET | Freq: Every day | ORAL | Status: DC
  Administered 2017-10-14 – 2017-10-16 (×3): 50 mg via ORAL
  Filled 2017-10-14 (×4): qty 1

## 2017-10-14 MED ORDER — DIPHENHYDRAMINE HCL 12.5 MG/5ML PO ELIX: 12.5000 mg | ORAL_SOLUTION | ORAL | Status: DC | PRN

## 2017-10-14 MED ORDER — METHADONE HCL 10 MG PO TABS
40.0000 mg | ORAL_TABLET | Freq: Every day | ORAL | Status: DC
  Administered 2017-10-14 – 2017-10-16 (×3): 40 mg via ORAL
  Filled 2017-10-14 (×3): qty 4

## 2017-10-14 MED ORDER — RIVAROXABAN 10 MG PO TABS
10.0000 mg | ORAL_TABLET | Freq: Every day | ORAL | Status: DC
  Administered 2017-10-15 – 2017-10-16 (×2): 10 mg via ORAL
  Filled 2017-10-14 (×2): qty 1

## 2017-10-14 MED ORDER — BICTEGRAVIR-EMTRICITAB-TENOFOV 50-200-25 MG PO TABS
1.0000 | ORAL_TABLET | Freq: Every day | ORAL | Status: DC
  Administered 2017-10-15 – 2017-10-16 (×2): 1 via ORAL
  Filled 2017-10-14 (×2): qty 1

## 2017-10-14 MED ORDER — CITALOPRAM HYDROBROMIDE 20 MG PO TABS
20.0000 mg | ORAL_TABLET | Freq: Every day | ORAL | Status: DC
  Administered 2017-10-14 – 2017-10-16 (×3): 20 mg via ORAL
  Filled 2017-10-14 (×3): qty 1

## 2017-10-14 MED ORDER — OXYCODONE HCL 5 MG/5ML PO SOLN: 5.0000 mg | Freq: Once | ORAL | Status: DC | PRN

## 2017-10-14 MED ORDER — LACTATED RINGERS IV SOLN
INTRAVENOUS | Status: DC
  Administered 2017-10-14 (×2): via INTRAVENOUS

## 2017-10-14 MED ORDER — PHENOL 1.4 % MT LIQD: 1.0000 | OROMUCOSAL | Status: DC | PRN

## 2017-10-14 MED ORDER — OXYCODONE HCL 5 MG PO TABS: 5.0000 mg | ORAL_TABLET | Freq: Once | ORAL | Status: DC | PRN

## 2017-10-14 MED ORDER — MIDAZOLAM HCL 2 MG/2ML IJ SOLN
INTRAMUSCULAR | Status: AC
  Administered 2017-10-14: 2 mg
  Filled 2017-10-14: qty 2

## 2017-10-14 MED ORDER — ALUM & MAG HYDROXIDE-SIMETH 200-200-20 MG/5ML PO SUSP: 30.0000 mL | ORAL | Status: DC | PRN

## 2017-10-14 MED ORDER — ACETAMINOPHEN 500 MG PO TABS
1000.0000 mg | ORAL_TABLET | Freq: Four times a day (QID) | ORAL | Status: AC
  Administered 2017-10-14 – 2017-10-15 (×4): 1000 mg via ORAL
  Filled 2017-10-14 (×4): qty 2

## 2017-10-14 MED ORDER — PROPOFOL 500 MG/50ML IV EMUL
INTRAVENOUS | Status: DC | PRN
  Administered 2017-10-14: 75 ug/kg/min via INTRAVENOUS
  Administered 2017-10-14: 16:00:00 via INTRAVENOUS

## 2017-10-14 MED ORDER — CEFAZOLIN SODIUM-DEXTROSE 2-4 GM/100ML-% IV SOLN
2.0000 g | Freq: Four times a day (QID) | INTRAVENOUS | Status: AC
  Administered 2017-10-14 – 2017-10-15 (×2): 2 g via INTRAVENOUS
  Filled 2017-10-14 (×2): qty 100

## 2017-10-14 MED ORDER — DEXTROSE 5 % IV SOLN
500.0000 mg | Freq: Four times a day (QID) | INTRAVENOUS | Status: DC | PRN
  Filled 2017-10-14: qty 5

## 2017-10-14 MED ORDER — ONDANSETRON HCL 4 MG PO TABS: 4.0000 mg | ORAL_TABLET | Freq: Four times a day (QID) | ORAL | Status: DC | PRN

## 2017-10-14 MED ORDER — KETOROLAC TROMETHAMINE 30 MG/ML IJ SOLN
INTRAMUSCULAR | Status: AC
  Filled 2017-10-14: qty 1

## 2017-10-14 MED ORDER — BISACODYL 10 MG RE SUPP: 10.0000 mg | Freq: Every day | RECTAL | Status: DC | PRN

## 2017-10-14 MED ORDER — METOCLOPRAMIDE HCL 5 MG/ML IJ SOLN: 5.0000 mg | Freq: Three times a day (TID) | INTRAMUSCULAR | Status: DC | PRN

## 2017-10-14 MED ORDER — KETOROLAC TROMETHAMINE 15 MG/ML IJ SOLN
7.5000 mg | Freq: Four times a day (QID) | INTRAMUSCULAR | Status: AC
  Administered 2017-10-14 – 2017-10-15 (×4): 7.5 mg via INTRAVENOUS
  Filled 2017-10-14 (×4): qty 1

## 2017-10-14 MED ORDER — MAGNESIUM CITRATE PO SOLN: 1.0000 | Freq: Once | ORAL | Status: DC | PRN

## 2017-10-14 MED ORDER — EPHEDRINE SULFATE-NACL 50-0.9 MG/10ML-% IV SOSY
PREFILLED_SYRINGE | INTRAVENOUS | Status: DC | PRN
  Administered 2017-10-14: 10 mg via INTRAVENOUS

## 2017-10-14 MED ORDER — TRAMADOL HCL 50 MG PO TABS
50.0000 mg | ORAL_TABLET | Freq: Four times a day (QID) | ORAL | Status: DC | PRN
  Administered 2017-10-16: 50 mg via ORAL
  Filled 2017-10-14: qty 1

## 2017-10-14 MED ORDER — OXYCODONE HCL 5 MG PO TABS
10.0000 mg | ORAL_TABLET | ORAL | Status: DC | PRN
  Administered 2017-10-15: 10 mg via ORAL
  Filled 2017-10-14: qty 2

## 2017-10-14 MED ORDER — DEXAMETHASONE SODIUM PHOSPHATE 10 MG/ML IJ SOLN
INTRAMUSCULAR | Status: DC | PRN
  Administered 2017-10-14: 10 mg via INTRAVENOUS

## 2017-10-14 MED ORDER — POLYETHYLENE GLYCOL 3350 17 G PO PACK: 17.0000 g | PACK | Freq: Every day | ORAL | Status: DC | PRN

## 2017-10-14 MED ORDER — PHENYLEPHRINE HCL 10 MG/ML IJ SOLN
INTRAVENOUS | Status: DC | PRN
  Administered 2017-10-14: 20 ug/min via INTRAVENOUS

## 2017-10-14 MED ORDER — ONDANSETRON HCL 4 MG/2ML IJ SOLN: 4.0000 mg | Freq: Four times a day (QID) | INTRAMUSCULAR | Status: DC | PRN

## 2017-10-14 MED ORDER — FENTANYL CITRATE (PF) 100 MCG/2ML IJ SOLN
INTRAMUSCULAR | Status: AC
  Administered 2017-10-14: 100 ug
  Filled 2017-10-14: qty 2

## 2017-10-14 MED ORDER — HYDROMORPHONE HCL 2 MG/ML IJ SOLN: 0.2500 mg | INTRAMUSCULAR | Status: DC | PRN

## 2017-10-14 MED ORDER — PROMETHAZINE HCL 25 MG/ML IJ SOLN
6.2500 mg | INTRAMUSCULAR | Status: DC | PRN
Start: 2017-10-14 — End: 2017-10-14

## 2017-10-14 MED ORDER — 0.9 % SODIUM CHLORIDE (POUR BTL) OPTIME
TOPICAL | Status: DC | PRN
  Administered 2017-10-14: 1000 mL

## 2017-10-14 MED ORDER — MENTHOL 3 MG MT LOZG: 1.0000 | LOZENGE | OROMUCOSAL | Status: DC | PRN

## 2017-10-14 MED ORDER — CEFAZOLIN SODIUM-DEXTROSE 2-4 GM/100ML-% IV SOLN
INTRAVENOUS | Status: AC
  Filled 2017-10-14: qty 100

## 2017-10-14 MED ORDER — ACETAMINOPHEN 325 MG PO TABS
325.0000 mg | ORAL_TABLET | Freq: Four times a day (QID) | ORAL | Status: DC | PRN
  Administered 2017-10-16: 650 mg via ORAL
  Filled 2017-10-14: qty 2

## 2017-10-14 MED ORDER — OXYCODONE HCL 5 MG PO TABS
20.0000 mg | ORAL_TABLET | ORAL | Status: DC | PRN
  Administered 2017-10-14 – 2017-10-16 (×5): 20 mg via ORAL
  Filled 2017-10-14 (×5): qty 4

## 2017-10-14 MED ORDER — DOCUSATE SODIUM 100 MG PO CAPS
100.0000 mg | ORAL_CAPSULE | Freq: Two times a day (BID) | ORAL | Status: DC
  Administered 2017-10-14 – 2017-10-16 (×4): 100 mg via ORAL
  Filled 2017-10-14 (×4): qty 1

## 2017-10-14 MED ORDER — CEFAZOLIN SODIUM-DEXTROSE 2-4 GM/100ML-% IV SOLN
2.0000 g | INTRAVENOUS | Status: AC
  Administered 2017-10-14: 2 g via INTRAVENOUS

## 2017-10-14 MED ORDER — BUPIVACAINE HCL (PF) 0.25 % IJ SOLN
INTRAMUSCULAR | Status: AC
  Filled 2017-10-14: qty 30

## 2017-10-14 MED ORDER — CHLORHEXIDINE GLUCONATE 4 % EX LIQD: 60.0000 mL | Freq: Once | CUTANEOUS | Status: DC

## 2017-10-14 MED ORDER — ZOLPIDEM TARTRATE 5 MG PO TABS: 5.0000 mg | ORAL_TABLET | Freq: Every evening | ORAL | Status: DC | PRN

## 2017-10-14 MED ORDER — BUPIVACAINE HCL (PF) 0.25 % IJ SOLN
INTRAMUSCULAR | Status: DC | PRN
  Administered 2017-10-14: 30 mL

## 2017-10-14 MED ORDER — QUETIAPINE FUMARATE 50 MG PO TABS
50.0000 mg | ORAL_TABLET | Freq: Every day | ORAL | Status: DC
  Administered 2017-10-14: 50 mg via ORAL
  Filled 2017-10-14 (×2): qty 1

## 2017-10-14 MED ORDER — SODIUM CHLORIDE 0.9 % IR SOLN
Status: DC | PRN
  Administered 2017-10-14: 1000 mL

## 2017-10-14 MED ORDER — PROPOFOL 10 MG/ML IV BOLUS
INTRAVENOUS | Status: DC | PRN
  Administered 2017-10-14: 40 mg via INTRAVENOUS
  Administered 2017-10-14: 30 mg via INTRAVENOUS
  Administered 2017-10-14: 20 mg via INTRAVENOUS

## 2017-10-14 MED ORDER — ROPIVACAINE HCL 5 MG/ML IJ SOLN
INTRAMUSCULAR | Status: DC | PRN
  Administered 2017-10-14 (×6): 5 mL via PERINEURAL

## 2017-10-14 MED ORDER — EPHEDRINE 5 MG/ML INJ
INTRAVENOUS | Status: AC
  Filled 2017-10-14: qty 10

## 2017-10-14 MED ORDER — GABAPENTIN 300 MG PO CAPS
300.0000 mg | ORAL_CAPSULE | Freq: Three times a day (TID) | ORAL | Status: DC
  Administered 2017-10-14 – 2017-10-16 (×5): 300 mg via ORAL
  Filled 2017-10-14 (×5): qty 1

## 2017-10-14 MED ORDER — ONDANSETRON HCL 4 MG/2ML IJ SOLN
INTRAMUSCULAR | Status: DC | PRN
  Administered 2017-10-14: 4 mg via INTRAVENOUS

## 2017-10-14 SURGICAL SUPPLY — 56 items
BANDAGE ESMARK 6X9 LF (GAUZE/BANDAGES/DRESSINGS) ×1 IMPLANT
BEARING TIBIAL PS 10X79/83 (Joint) ×2 IMPLANT
BLADE SAG 18X100X1.27 (BLADE) ×2 IMPLANT
BLADE SAW SGTL 13X75X1.27 (BLADE) ×2 IMPLANT
BNDG ELASTIC 6X10 VLCR STRL LF (GAUZE/BANDAGES/DRESSINGS) ×2 IMPLANT
BNDG ESMARK 6X9 LF (GAUZE/BANDAGES/DRESSINGS) ×2
BOWL SMART MIX CTS (DISPOSABLE) ×2 IMPLANT
CEMENT BONE R 1X40 (Cement) ×4 IMPLANT
CLSR STERI-STRIP ANTIMIC 1/2X4 (GAUZE/BANDAGES/DRESSINGS) ×4 IMPLANT
COMP FEM VG IL 70 LT (Knees) ×2 IMPLANT
COMPONENT FEM VG IL 70 LT (Knees) ×1 IMPLANT
COVER SURGICAL LIGHT HANDLE (MISCELLANEOUS) ×2 IMPLANT
CUFF TOURNIQUET SINGLE 34IN LL (TOURNIQUET CUFF) ×2 IMPLANT
DISTAL FEMORAL PEG (Knees) ×2 IMPLANT
DRAPE EXTREMITY T 121X128X90 (DRAPE) ×2 IMPLANT
DRAPE HALF SHEET 40X57 (DRAPES) ×2 IMPLANT
DRAPE U-SHAPE 47X51 STRL (DRAPES) ×2 IMPLANT
DRSG MEPILEX BORDER 4X8 (GAUZE/BANDAGES/DRESSINGS) ×2 IMPLANT
DURAPREP 26ML APPLICATOR (WOUND CARE) ×2 IMPLANT
ELECT CAUTERY BLADE 6.4 (BLADE) ×2 IMPLANT
ELECT REM PT RETURN 9FT ADLT (ELECTROSURGICAL) ×2
ELECTRODE REM PT RTRN 9FT ADLT (ELECTROSURGICAL) ×1 IMPLANT
GLOVE BIOGEL PI ORTHO PRO SZ8 (GLOVE) ×2
GLOVE ORTHO TXT STRL SZ7.5 (GLOVE) ×2 IMPLANT
GLOVE PI ORTHO PRO STRL SZ8 (GLOVE) ×2 IMPLANT
GLOVE SURG ORTHO 8.0 STRL STRW (GLOVE) ×2 IMPLANT
GOWN STRL REUS W/ TWL XL LVL3 (GOWN DISPOSABLE) ×1 IMPLANT
GOWN STRL REUS W/TWL 2XL LVL3 (GOWN DISPOSABLE) ×2 IMPLANT
GOWN STRL REUS W/TWL XL LVL3 (GOWN DISPOSABLE) ×1
HANDPIECE INTERPULSE COAX TIP (DISPOSABLE) ×1
HOOD PEEL AWAY FACE SHEILD DIS (HOOD) ×6 IMPLANT
HOOD PEEL AWAY FLYTE STAYCOOL (MISCELLANEOUS) ×2 IMPLANT
IMMOBILIZER KNEE 22 (SOFTGOODS) ×2 IMPLANT
KIT BASIN OR (CUSTOM PROCEDURE TRAY) ×2 IMPLANT
KIT TURNOVER KIT B (KITS) ×2 IMPLANT
MANIFOLD NEPTUNE II (INSTRUMENTS) ×2 IMPLANT
NEEDLE 18GX1X1/2 (RX/OR ONLY) (NEEDLE) ×2 IMPLANT
NS IRRIG 1000ML POUR BTL (IV SOLUTION) ×2 IMPLANT
PACK TOTAL JOINT (CUSTOM PROCEDURE TRAY) ×2 IMPLANT
PAD ABD 8X10 STRL (GAUZE/BANDAGES/DRESSINGS) ×2 IMPLANT
PAD ARMBOARD 7.5X6 YLW CONV (MISCELLANEOUS) ×4 IMPLANT
PATELLA STD 34X8.5 (Orthopedic Implant) ×2 IMPLANT
PEG FEMORAL DISTAL (Knees) ×1 IMPLANT
PLATE KNEE TIBIAL 79MM FIXED (Plate) ×2 IMPLANT
SET HNDPC FAN SPRY TIP SCT (DISPOSABLE) ×1 IMPLANT
SUCTION FRAZIER HANDLE 10FR (MISCELLANEOUS) ×1
SUCTION TUBE FRAZIER 10FR DISP (MISCELLANEOUS) ×1 IMPLANT
SUT VIC AB 0 CT1 27 (SUTURE) ×2
SUT VIC AB 0 CT1 27XBRD ANBCTR (SUTURE) ×2 IMPLANT
SUT VIC AB 2-0 CT1 27 (SUTURE) ×2
SUT VIC AB 2-0 CT1 TAPERPNT 27 (SUTURE) ×2 IMPLANT
SUT VIC AB 3-0 SH 8-18 (SUTURE) ×2 IMPLANT
SYR 30ML LL (SYRINGE) ×2 IMPLANT
TOWEL OR 17X26 10 PK STRL BLUE (TOWEL DISPOSABLE) ×2 IMPLANT
TRAY CATH 16FR W/PLASTIC CATH (SET/KITS/TRAYS/PACK) IMPLANT
TRAY FOLEY W/BAG SLVR 14FR (SET/KITS/TRAYS/PACK) ×2 IMPLANT

## 2017-10-14 NOTE — Anesthesia Procedure Notes (Signed)
Spinal  Start time: 10/14/2017 1:52 PM End time: 10/14/2017 1:56 PM Staffing Anesthesiologist: Lyn Hollingshead, MD Performed: anesthesiologist  Preanesthetic Checklist Completed: patient identified, site marked, surgical consent, pre-op evaluation, timeout performed, IV checked, risks and benefits discussed and monitors and equipment checked Spinal Block Patient position: sitting Prep: site prepped and draped and DuraPrep Patient monitoring: continuous pulse ox and blood pressure Approach: midline Location: L3-4 Injection technique: single-shot Needle Needle type: Pencan  Needle gauge: 24 G Needle length: 10 cm Needle insertion depth: 5 cm Assessment Sensory level: T8

## 2017-10-14 NOTE — Anesthesia Procedure Notes (Signed)
Anesthesia Regional Block: Adductor canal block   Pre-Anesthetic Checklist: ,, timeout performed, Correct Patient, Correct Site, Correct Laterality, Correct Procedure, Correct Position, site marked, Risks and benefits discussed,  Surgical consent,  Pre-op evaluation,  At surgeon's request and post-op pain management  Laterality: Left and Lower  Prep: chloraprep       Needles:  Injection technique: Single-shot  Needle Type: Echogenic Stimulator Needle     Needle Length: 10cm  Needle Gauge: 21   Needle insertion depth: 3 cm   Additional Needles:   Procedures:,,,, ultrasound used (permanent image in chart),,,,  Narrative:  Start time: 10/14/2017 1:20 PM End time: 10/14/2017 1:28 PM Injection made incrementally with aspirations every 5 mL.

## 2017-10-14 NOTE — Op Note (Signed)
DATE OF SURGERY:  10/14/2017 TIME: 5:05 PM  PATIENT NAME:  Jesse Munoz   AGE: 42 y.o.    PRE-OPERATIVE DIAGNOSIS: Avascular necrosis left knee  POST-OPERATIVE DIAGNOSIS:  Same  PROCEDURE: Left total Knee Arthroplasty  SURGEON:  Johnny Bridge, MD   ASSISTANT:  Joya Gaskins, OPA-C, present and scrubbed throughout the case, critical for assistance with exposure, retraction, instrumentation, and closure.   OPERATIVE IMPLANTS: [employer]omet Vanguard Fixed Bearing Posterior Stabilized Femur size 70, Tibia size 79, Patella size 34 3-peg oval button, with a 10 mm polyethylene insert.  Anesthesia: Spinal anesthetic with regional block and intra-articular capsular injection with 30 mL of half percent Marcaine with Toradol.  PREOPERATIVE INDICATIONS:  Jesse Munoz is a 42 y.o. year old male with avascular necrosis of the left knee who failed conservative treatment, including injections, antiinflammatories, activity modification, and assistive devices, and had significant impairment of their activities of daily living, and elected for Total Knee Arthroplasty.   The risks, benefits, and alternatives were discussed at length including but not limited to the risks of infection, bleeding, nerve injury, stiffness, blood clots, the need for revision surgery, cardiopulmonary complications, among others, and they were willing to proceed.  OPERATIVE FINDINGS AND UNIQUE ASPECTS OF THE CASE: The patella measured 29 mm before the cut, and 18 mm after the cut.  He had severe preoperative varus deformity of at least 30 degrees, with range of motion under anesthesia of 30 to 90 degrees preoperatively.  The medial femoral condyle had substantial collapse, particularly in the posterior aspect.  The bone had a cheesy type appearance to it.  There was almost to the point where I needed and augment, there was one fragmented piece that was still present after the chamfer cuts, but it remained intact, so I used cement to help  augment the "fracture".  Access to the knee was extremely difficult.  I cut the femur twice, and also cut the tibia twice.  I cut the tibia with the jig set on the swinging the arm of the jig medially distally to match his crest.  Even having done that, I barely skimmed the bone medially, and laterally I took about 8 mm.  Achieving soft tissue balance was extremely challenging as he had a lot of medial contracture in his MCL.  The lateral side was still fairly loose, the medial side was extremely tight, but when the final implants were in I had good clinical alignment in the coronal plane, and he had range of motion from 10 degrees to 115 degrees.  I debated how to get him into full extension, but I did not want to take more bone, so I accepted significant improvement in his ability to extend the knee, although he did not quite reach full extension.   ESTIMATED BLOOD LOSS: 200 mL  OPERATIVE DESCRIPTION:  The patient was brought to the operative room and placed in a supine position.  Anesthesia was administered.  IV antibiotics were given.  The lower extremity was prepped and draped in the usual sterile fashion.  Time out was performed.  The leg was elevated and exsanguinated and the tourniquet was inflated.  Anterior quadriceps tendon splitting approach was performed.  We had to go fairly high on the split in order to get the patella mobilized.  The patella was everted and osteophytes were removed.  The anterior horn of the medial and lateral meniscus was removed.  I measured the patella and it measured 29 mm and then performed a patellar  resection taking it down to 18 mm.  The patella was substantially wider than it was tall, and although it could have accommodated a larger implant medial to lateral, from top to bottom a 34 maximize the bone coverage.  The distal femur was opened with the drill and the intramedullary distal femoral cutting jig was utilized, set at 5 degrees resecting 9 mm off the  distal femur, however this barely scraped the collapsed medial femoral condyle, and I resected 2 more millimeters.  Care was taken to protect the collateral ligaments.  Then the extramedullary tibial cutting jig was utilized making the appropriate cut using the anterior tibial crest as a reference building in appropriate posterior slope.  The jig was set on four.  Care was taken during the cut to protect the medial and collateral ligaments.  The proximal tibia was removed along with the posterior horns of the menisci.  The PCL was sacrificed.    The extensor gap was measured and was approximately 8 mm, and I went back and took 2 more millimeters off of the tibia.  My extensor gap was improved, and was nearly 10, although it was challenging to get a full assessment, and I was concerned because I had a fairly significant lateral gap, and medially I was not corrected yet, but I anticipated getting more medial correction with soft tissue release and osteophyte removal, so I proceeded with the preparation of the femur.  The distal femoral sizing jig was applied, taking care to avoid notching.  He was between a 72.5 and a 70, I went with the 70 to try and maximize the flexion gap, and just barely avoided notching.  Then the 4-in-1 cutting jig was applied and the anterior and posterior femur was cut, along with the chamfer cuts.  All posterior osteophytes were removed.  The flexion gap was then measured and excellent.  The extension gap continue to improve as we worked through the case.  I completed the distal femoral preparation using the appropriate jig to prepare the box.  The proximal tibia sized and prepared accordingly with the reamer and the punch, and then all components were trialed with the 35mm poly insert.  The knee was found to have adequate balance, and had improved although not complete motion, and he still had a little bit of a flexion contracture, but had regained significant flexion.  I dropped  the tourniquet at the completion of the trialing, because I was at 2 hours.  Excellent hemostasis was achieved.  The above named components were then cemented into place and all excess cement was removed.  The real polyethylene implant was placed.    The knee was easily taken through a range of motion and the patella tracked well and the knee irrigated copiously and the parapatellar and subcutaneous tissue closed with vicryl, and monocryl with steri strips for the skin.  The wounds were injected with marcaine, and dressed with sterile gauze and the patient was awakened and returned to the PACU in stable and satisfactory condition.  There were no complications.  Total tourniquet time was 120 minutes.
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
## 2017-10-14 NOTE — Transfer of Care (Signed)
Immediate Anesthesia Transfer of Care Note  Patient: Jesse Munoz  Procedure(s) Performed: LEFT TOTAL KNEE ARTHROPLASTY (Left Knee)  Patient Location: PACU  Anesthesia Type:Spinal  Level of Consciousness: awake, patient cooperative and confused  Airway & Oxygen Therapy: Patient Spontanous Breathing  Post-op Assessment: Report given to RN and Post -op Vital signs reviewed and stable  Post vital signs: Reviewed and stable  Last Vitals:  Vitals Value Taken Time  BP 136/89 10/14/2017  5:34 PM  Temp    Pulse 77 10/14/2017  5:35 PM  Resp 10 10/14/2017  5:35 PM  SpO2 100 % 10/14/2017  5:35 PM  Vitals shown include unvalidated device data.  Last Pain:  Vitals:   10/14/17 0813  TempSrc:   PainSc: 8          Complications: No apparent anesthesia complications

## 2017-10-14 NOTE — Anesthesia Preprocedure Evaluation (Addendum)
Anesthesia Evaluation  Patient identified by MRN, date of birth, ID band Patient awake    Reviewed: Allergy & Precautions, NPO status , Patient's Chart, lab work & pertinent test results  Airway Mallampati: II  TM Distance: >3 FB     Dental  (+) Poor Dentition, Chipped, Dental Advisory Given   Pulmonary Current Smoker,    breath sounds clear to auscultation       Cardiovascular hypertension, Pt. on medications  Rhythm:Regular Rate:Normal     Neuro/Psych PSYCHIATRIC DISORDERS Anxiety Bipolar Disorder Schizophrenia  Neuromuscular disease CVA    GI/Hepatic negative GI ROS, Neg liver ROS, (+) Cirrhosis   ascites  substance abuse  alcohol use, cocaine use, marijuana use and IV drug use, Hepatitis -, C  Endo/Other  negative endocrine ROS  Renal/GU negative Renal ROS  negative genitourinary   Musculoskeletal  (+) Arthritis , Osteoarthritis,  Hx/o AVN   Abdominal Normal abdominal exam  (+)   Peds  Hematology  (+) HIV,   Anesthesia Other Findings   Reproductive/Obstetrics                            Anesthesia Physical  Anesthesia Plan  ASA: III  Anesthesia Plan: General   Post-op Pain Management:    Induction: Intravenous  PONV Risk Score and Plan: 1 and Treatment may vary due to age or medical condition and Ondansetron  Airway Management Planned: Oral ETT and LMA  Additional Equipment:   Intra-op Plan:   Post-operative Plan: Extubation in OR  Informed Consent: I have reviewed the patients History and Physical, chart, labs and discussed the procedure including the risks, benefits and alternatives for the proposed anesthesia with the patient or authorized representative who has indicated his/her understanding and acceptance.   Dental advisory given  Plan Discussed with: CRNA and Surgeon  Anesthesia Plan Comments:        Anesthesia Quick Evaluation

## 2017-10-15 ENCOUNTER — Encounter (HOSPITAL_COMMUNITY): Payer: Self-pay | Admitting: Orthopedic Surgery

## 2017-10-15 ENCOUNTER — Other Ambulatory Visit: Payer: Self-pay

## 2017-10-15 LAB — CBC
HCT: 38.6 % — ABNORMAL LOW (ref 39.0–52.0)
Hemoglobin: 12.8 g/dL — ABNORMAL LOW (ref 13.0–17.0)
MCH: 26.6 pg (ref 26.0–34.0)
MCHC: 33.2 g/dL (ref 30.0–36.0)
MCV: 80.1 fL (ref 78.0–100.0)
PLATELETS: 181 10*3/uL (ref 150–400)
RBC: 4.82 MIL/uL (ref 4.22–5.81)
RDW: 15.2 % (ref 11.5–15.5)
WBC: 14.6 10*3/uL — AB (ref 4.0–10.5)

## 2017-10-15 LAB — BASIC METABOLIC PANEL
ANION GAP: 11 (ref 5–15)
BUN: 13 mg/dL (ref 6–20)
CALCIUM: 9.2 mg/dL (ref 8.9–10.3)
CO2: 27 mmol/L (ref 22–32)
Chloride: 101 mmol/L (ref 101–111)
Creatinine, Ser: 1.34 mg/dL — ABNORMAL HIGH (ref 0.61–1.24)
GLUCOSE: 120 mg/dL — AB (ref 65–99)
POTASSIUM: 3.6 mmol/L (ref 3.5–5.1)
Sodium: 139 mmol/L (ref 135–145)

## 2017-10-15 MED ORDER — OXYCODONE HCL ER 10 MG PO T12A: 10.0000 mg | EXTENDED_RELEASE_TABLET | Freq: Two times a day (BID) | ORAL | 0 refills | Status: DC

## 2017-10-15 MED ORDER — RIVAROXABAN 10 MG PO TABS: 10.0000 mg | ORAL_TABLET | Freq: Every day | ORAL | 0 refills | Status: DC

## 2017-10-15 MED ORDER — OXYCODONE HCL 10 MG PO TABS: 10.0000 mg | ORAL_TABLET | Freq: Four times a day (QID) | ORAL | 0 refills | Status: DC | PRN

## 2017-10-15 NOTE — Anesthesia Postprocedure Evaluation (Signed)
Anesthesia Post Note  Patient: Jesse Munoz  Procedure(s) Performed: LEFT TOTAL KNEE ARTHROPLASTY (Left Knee)     Patient location during evaluation: PACU Anesthesia Type: Regional and Spinal Level of consciousness: oriented and awake and alert Pain management: pain level controlled Vital Signs Assessment: post-procedure vital signs reviewed and stable Respiratory status: spontaneous breathing, respiratory function stable and patient connected to nasal cannula oxygen Cardiovascular status: blood pressure returned to baseline and stable Postop Assessment: no headache, no backache, no apparent nausea or vomiting and patient able to bend at knees Anesthetic complications: no    Last Vitals:  Vitals:   10/14/17 2106 10/15/17 0406  BP: 118/81 (!) 97/53  Pulse: 67 76  Resp:    Temp: 36.5 C 37 C  SpO2: 100% 100%    Last Pain:  Vitals:   10/15/17 0518  TempSrc:   PainSc: 7                  [name],W. EDMOND

## 2017-10-15 NOTE — Discharge Instructions (Signed)
INSTRUCTIONS AFTER JOINT REPLACEMENT  ° °o Remove items at home which could result in a fall. This includes throw rugs or furniture in walking pathways °o ICE to the affected joint every three hours while awake for 30 minutes at a time, for at least the first 3-5 days, and then as needed for pain and swelling.  Continue to use ice for pain and swelling. You may notice swelling that will progress down to the foot and ankle.  This is normal after surgery.  Elevate your leg when you are not up walking on it.   °o Continue to use the breathing machine you got in the hospital (incentive spirometer) which will help keep your temperature down.  It is common for your temperature to cycle up and down following surgery, especially at night when you are not up moving around and exerting yourself.  The breathing machine keeps your lungs expanded and your temperature down. ° ° °DIET:  As you were doing prior to hospitalization, we recommend a well-balanced diet. ° °DRESSING / WOUND CARE / SHOWERING ° °You may change your dressing 3-5 days after surgery.  Then change the dressing every day with sterile gauze.  Please use good hand washing techniques before changing the dressing.  Do not use any lotions or creams on the incision until instructed by your surgeon. ° °ACTIVITY ° °o Increase activity slowly as tolerated, but follow the weight bearing instructions below.   °o No driving for 6 weeks or until further direction given by your physician.  You cannot drive while taking narcotics.  °o No lifting or carrying greater than 10 lbs. until further directed by your surgeon. °o Avoid periods of inactivity such as sitting longer than an hour when not asleep. This helps prevent blood clots.  °o You may return to work once you are authorized by your doctor.  ° ° ° °WEIGHT BEARING  ° °Weight bearing as tolerated with assist device (walker, cane, etc) as directed, use it as long as suggested by your surgeon or therapist, typically at  least 4-6 weeks. ° ° °EXERCISES ° °Results after joint replacement surgery are often greatly improved when you follow the exercise, range of motion and muscle strengthening exercises prescribed by your doctor. Safety measures are also important to protect the joint from further injury. Any time any of these exercises cause you to have increased pain or swelling, decrease what you are doing until you are comfortable again and then slowly increase them. If you have problems or questions, call your caregiver or physical therapist for advice.  ° °Rehabilitation is important following a joint replacement. After just a few days of immobilization, the muscles of the leg can become weakened and shrink (atrophy).  These exercises are designed to build up the tone and strength of the thigh and leg muscles and to improve motion. Often times heat used for twenty to thirty minutes before working out will loosen up your tissues and help with improving the range of motion but do not use heat for the first two weeks following surgery (sometimes heat can increase post-operative swelling).  ° °These exercises can be done on a training (exercise) mat, on the floor, on a table or on a bed. Use whatever works the best and is most comfortable for you.    Use music or television while you are exercising so that the exercises are a pleasant break in your day. This will make your life better with the exercises acting as a break   in your routine that you can look forward to.   Perform all exercises about fifteen times, three times per day or as directed.  You should exercise both the operative leg and the other leg as well. ° °Exercises include: °  °• Quad Sets - Tighten up the muscle on the front of the thigh (Quad) and hold for 5-10 seconds.   °• Straight Leg Raises - With your knee straight (if you were given a brace, keep it on), lift the leg to 60 degrees, hold for 3 seconds, and slowly lower the leg.  Perform this exercise against  resistance later as your leg gets stronger.  °• Leg Slides: Lying on your back, slowly slide your foot toward your buttocks, bending your knee up off the floor (only go as far as is comfortable). Then slowly slide your foot back down until your leg is flat on the floor again.  °• Angel Wings: Lying on your back spread your legs to the side as far apart as you can without causing discomfort.  °• Hamstring Strength:  Lying on your back, push your heel against the floor with your leg straight by tightening up the muscles of your buttocks.  Repeat, but this time bend your knee to a comfortable angle, and push your heel against the floor.  You may put a pillow under the heel to make it more comfortable if necessary.  ° °A rehabilitation program following joint replacement surgery can speed recovery and prevent re-injury in the future due to weakened muscles. Contact your doctor or a physical therapist for more information on knee rehabilitation.  ° ° °CONSTIPATION ° °Constipation is defined medically as fewer than three stools per week and severe constipation as less than one stool per week.  Even if you have a regular bowel pattern at home, your normal regimen is likely to be disrupted due to multiple reasons following surgery.  Combination of anesthesia, postoperative narcotics, change in appetite and fluid intake all can affect your bowels.  ° °YOU MUST use at least one of the following options; they are listed in order of increasing strength to get the job done.  They are all available over the counter, and you may need to use some, POSSIBLY even all of these options:   ° °Drink plenty of fluids (prune juice may be helpful) and high fiber foods °Colace 100 mg by mouth twice a day  °Senokot for constipation as directed and as needed Dulcolax (bisacodyl), take with full glass of water  °Miralax (polyethylene glycol) once or twice a day as needed. ° °If you have tried all these things and are unable to have a bowel  movement in the first 3-4 days after surgery call either your surgeon or your primary doctor.   ° °If you experience loose stools or diarrhea, hold the medications until you stool forms back up.  If your symptoms do not get better within 1 week or if they get worse, check with your doctor.  If you experience "the worst abdominal pain ever" or develop nausea or vomiting, please contact the office immediately for further recommendations for treatment. ° ° °ITCHING:  If you experience itching with your medications, try taking only a single pain pill, or even half a pain pill at a time.  You can also use Benadryl over the counter for itching or also to help with sleep.  ° °TED HOSE STOCKINGS:  Use stockings on both legs until for at least 2 weeks or as   directed by physician office. They may be removed at night for sleeping. ° °MEDICATIONS:  See your medication summary on the “After Visit Summary” that nursing will review with you.  You may have some home medications which will be placed on hold until you complete the course of blood thinner medication.  It is important for you to complete the blood thinner medication as prescribed. ° °PRECAUTIONS:  If you experience chest pain or shortness of breath - call 911 immediately for transfer to the hospital emergency department.  ° °If you develop a fever greater that 101 F, purulent drainage from wound, increased redness or drainage from wound, foul odor from the wound/dressing, or calf pain - CONTACT YOUR SURGEON.   °                                                °FOLLOW-UP APPOINTMENTS:  If you do not already have a post-op appointment, please call the office for an appointment to be seen by your surgeon.  Guidelines for how soon to be seen are listed in your “After Visit Summary”, but are typically between 1-4 weeks after surgery. ° °OTHER INSTRUCTIONS:  ° °Knee Replacement:  Do not place pillow under knee, focus on keeping the knee straight while resting. CPM  instructions: 0-90 degrees, 2 hours in the morning, 2 hours in the afternoon, and 2 hours in the evening. Place foam block, curve side up under heel at all times except when in CPM or when walking.  DO NOT modify, tear, cut, or change the foam block in any way. ° °MAKE SURE YOU:  °• Understand these instructions.  °• Get help right away if you are not doing well or get worse.  ° ° °Thank you for letting us be a part of your medical care team.  It is a privilege we respect greatly.  We hope these instructions will help you stay on track for a fast and full recovery!  ° ° ° °Information on my medicine - XARELTO® (Rivaroxaban) ° °This medication education was reviewed with me or my healthcare representative as part of my discharge preparation.  The pharmacist that spoke with me during my hospital stay was:  Joci Dress Dien, RPH ° °Why was Xarelto® prescribed for you? °Xarelto® was prescribed for you to reduce the risk of blood clots forming after orthopedic surgery. The medical term for these abnormal blood clots is venous thromboembolism (VTE). ° °What do you need to know about xarelto® ? °Take your Xarelto® ONCE DAILY at the same time every day. °You may take it either with or without food. ° °If you have difficulty swallowing the tablet whole, you may crush it and mix in applesauce just prior to taking your dose. ° °Take Xarelto® exactly as prescribed by your doctor and DO NOT stop taking Xarelto® without talking to the doctor who prescribed the medication.  Stopping without other VTE prevention medication to take the place of Xarelto® may increase your risk of developing a clot. ° °After discharge, you should have regular check-up appointments with your healthcare provider that is prescribing your Xarelto®.   ° °What do you do if you miss a dose? °If you miss a dose, take it as soon as you remember on the same day then continue your regularly scheduled once daily regimen the next day. Do not take two   doses of  Xarelto® on the same day.  ° °Important Safety Information °A possible side effect of Xarelto® is bleeding. You should call your healthcare provider right away if you experience any of the following: °? Bleeding from an injury or your nose that does not stop. °? Unusual colored urine (red or dark brown) or unusual colored stools (red or black). °? Unusual bruising for unknown reasons. °? A serious fall or if you hit your head (even if there is no bleeding). ° °Some medicines may interact with Xarelto® and might increase your risk of bleeding while on Xarelto®. To help avoid this, consult your healthcare provider or pharmacist prior to using any new prescription or non-prescription medications, including herbals, vitamins, non-steroidal anti-inflammatory drugs (NSAIDs) and supplements. ° °This website has more information on Xarelto®: www.xarelto.com. ° ° ° ° °

## 2017-10-15 NOTE — Progress Notes (Addendum)
Patient ID: Jesse Munoz, male   DOB: 1976/04/12, 42 y.o.   MRN: [id number]     Subjective:  Patient reports pain as mild to moderate.  Patient in bed and complaining of severe pain  Denies CP or SOB  Objective:   VITALS:   Vitals:   10/14/17 2020 10/14/17 2035 10/14/17 2106 10/15/17 0406  BP: 116/76 109/70 118/81 (!) 97/53  Pulse: 70 70 67 76  Resp: 10 12    Temp:  97.7 F (36.5 C) 97.7 F (36.5 C) 98.6 F (37 C)  TempSrc:   Oral   SpO2: 100% 100% 100% 100%  Weight:      Height:        ABD soft Sensation intact distally Dorsiflexion/Plantar flexion intact Incision: dressing C/D/I and no drainage   Lab Results  Component Value Date   WBC 14.6 (H) 10/15/2017   HGB 12.8 (L) 10/15/2017   HCT 38.6 (L) 10/15/2017   MCV 80.1 10/15/2017   PLT 181 10/15/2017   BMET    Component Value Date/Time   NA 139 10/15/2017 0504   K 3.6 10/15/2017 0504   CL 101 10/15/2017 0504   CO2 27 10/15/2017 0504   GLUCOSE 120 (H) 10/15/2017 0504   BUN 13 10/15/2017 0504   CREATININE 1.34 (H) 10/15/2017 0504   CREATININE 0.89 09/24/2017 0937   CALCIUM 9.2 10/15/2017 0504   GFRNONAA >60 10/15/2017 0504   GFRNONAA 104 09/24/2017 0937   GFRAA >60 10/15/2017 0504   GFRAA 121 09/24/2017 0937     Assessment/Plan: 1 Day Post-Op   Principal Problem:   Primary localized osteoarthrosis of the knee, left (AVN) Active Problems:   Primary localized osteoarthritis of left knee   Advance diet Up with therapy WBAT Dry dressing PRN   Anticipated LOS equal to or greater than 2 midnights due to - Age 72 and older with one or more of the following:  - Obesity  - Expected need for hospital services (PT, OT, Nursing) required for safe  discharge  - Anticipated need for postoperative skilled nursing care or inpatient rehab  - Active co-morbidities: Chronic pain requiring opiods OR   - Unanticipated findings during/Post Surgery: Slow post-op progression: GI, pain control, mobility  -  Patient is a high risk of re-admission due to: None     DOUGLAS PARRY, BRANDON 10/15/2017, 11:36 AM   Seen and agree with above.    Marchia Bond, MD Cell [phone]

## 2017-10-15 NOTE — Evaluation (Signed)
Physical Therapy Evaluation Patient Details Name: Jesse Munoz MRN: [id number] DOB: 1975/08/12 Today's Date: 10/15/2017   History of Present Illness  Pt is a 42 y/o male s/p L TKA. PMH including but not limited to Bipolar disorder, PTSD, HIV, HTN, Schizophrenia, Stroke in 2016.    Clinical Impression  Pt presented supine in bed with HOB elevated, awake and willing to participate in therapy session. Prior to admission, pt reported that he ambulated with [facility] and was independent with ADLs. Pt will have family to assist him intermittently upon d/c home. Pt currently requires min A for bed mobility, min A for transfers with RW and min guard for short distance ambulation within his room. Pt limited this session secondary to expected L knee pain post-op. PT will continue to follow acutely to progress mobility as tolerated and to ensure a safe d/c home. Pt would continue to benefit from skilled physical therapy services at this time while admitted and after d/c to address the below listed limitations in order to improve overall safety and independence with functional mobility.     Follow Up Recommendations Home health PT;Supervision/Assistance - 24 hour    Equipment Recommendations  None recommended by PT    Recommendations for Other Services       Precautions / Restrictions Precautions Precautions: Fall;Knee Precaution Comments: reviewed positioning of LE following TKA Restrictions Weight Bearing Restrictions: Yes LLE Weight Bearing: Weight bearing as tolerated      Mobility  Bed Mobility Overal bed mobility: Needs Assistance Bed Mobility: Supine to Sit     Supine to sit: Min assist     General bed mobility comments: increased time, min A for movement of L LE off of bed  Transfers Overall transfer level: Needs assistance Equipment used: Rolling walker (2 wheeled) Transfers: Sit to/from Stand Sit to Stand: Min assist         General transfer comment: increased time and effort,  cueing for safe hand placement and technique, min A for stability with transition into standing from EOB  Ambulation/Gait Ambulation/Gait assistance: Min guard Ambulation Distance (Feet): 20 Feet Assistive device: Rolling walker (2 wheeled) Gait Pattern/deviations: Step-to pattern;Step-through pattern;Decreased step length - right;Decreased step length - left;Decreased stride length;Decreased weight shift to left;Antalgic Gait velocity: decreased Gait velocity interpretation: <1.31 ft/sec, indicative of household ambulator General Gait Details: pt with mild instability but no overt LOB or need for physical assistance, min guard for safety; pt guarded with ambulation and WB'ing on L LE; pt also with some genu valgus of L LE during stance phase  Stairs            Wheelchair Mobility    Modified Rankin (Stroke Patients Only)       Balance Overall balance assessment: Needs assistance Sitting-balance support: Feet supported Sitting balance-Leahy Scale: Good     Standing balance support: During functional activity;Bilateral upper extremity supported Standing balance-Leahy Scale: Poor                               Pertinent Vitals/Pain Pain Assessment: 0-10 Pain Score: 8  Pain Location: L knee Pain Descriptors / Indicators: Sore;Grimacing;Guarding Pain Intervention(s): Monitored during session;Repositioned    Home Living Family/patient expects to be discharged to:: Private residence Living Arrangements: Other relatives Available Help at Discharge: Family;Available PRN/intermittently   Home Access: Level entry     Home Layout: One level Home Equipment: Clinical cytogeneticist - 2 wheels;Cane - single point  Prior Function Level of Independence: Independent with assistive device(s)         Comments: pt ambulates with use of SPC     Hand Dominance        Extremity/Trunk Assessment   Upper Extremity Assessment Upper Extremity Assessment: Overall  WFL for tasks assessed    Lower Extremity Assessment Lower Extremity Assessment: LLE deficits/detail LLE Deficits / Details: pt with decreased AROM and strength secondary to post-op and pain. Pt reported sensation grossly intact to light touch in toes and foot    Cervical / Trunk Assessment Cervical / Trunk Assessment: Normal  Communication   Communication: No difficulties  Cognition Arousal/Alertness: Awake/alert Behavior During Therapy: WFL for tasks assessed/performed Overall Cognitive Status: Within Functional Limits for tasks assessed                                        General Comments      Exercises     Assessment/Plan    PT Assessment Patient needs continued PT services  PT Problem List Decreased strength;Decreased range of motion;Decreased activity tolerance;Decreased balance;Decreased mobility;Decreased coordination;Decreased knowledge of use of DME;Decreased safety awareness;Decreased knowledge of precautions;Pain       PT Treatment Interventions DME instruction;Gait training;Stair training;Functional mobility training;Therapeutic activities;Therapeutic exercise;Balance training;Neuromuscular re-education;Patient/family education    PT Goals (Current goals can be found in the Care Plan section)  Acute Rehab PT Goals Patient Stated Goal: return home, decrease pain PT Goal Formulation: With patient Time For Goal Achievement: 10/29/17 Potential to Achieve Goals: Good    Frequency 7X/week   Barriers to discharge        Co-evaluation               AM-PAC PT "6 Clicks" Daily Activity  Outcome Measure Difficulty turning over in bed (including adjusting bedclothes, sheets and blankets)?: None Difficulty moving from lying on back to sitting on the side of the bed? : Unable Difficulty sitting down on and standing up from a chair with arms (e.g., wheelchair, bedside commode, etc,.)?: Unable Help needed moving to and from a bed to chair  (including a wheelchair)?: None Help needed walking in hospital room?: A Little Help needed climbing 3-5 steps with a railing? : A Little 6 Click Score: 16    End of Session Equipment Utilized During Treatment: Gait belt Activity Tolerance: Patient limited by pain Patient left: in chair;with call bell/phone within reach;with chair alarm set Nurse Communication: Mobility status PT Visit Diagnosis: Other abnormalities of gait and mobility (R26.89);Pain Pain - Right/Left: Left Pain - part of body: Knee    Time: 0900-0918 PT Time Calculation (min) (ACUTE ONLY): 18 min   Charges:   PT Evaluation $PT Eval Low Complexity: 1 Low     PT G Codes:        [place], PT, DPT [place] 10/15/2017, 11:20 AM

## 2017-10-15 NOTE — Progress Notes (Signed)
Physical Therapy Treatment Patient Details Name: Jesse Munoz MRN: [id number] DOB: December 20, 1975 Today's Date: 10/15/2017    History of Present Illness Pt is a 42 y/o male s/p L TKA. PMH including but not limited to Bipolar disorder, PTSD, HIV, HTN, Schizophrenia, Stroke in 2016.    PT Comments    Pt making good progress with functional mobility and tolerated ambulating a further distance this session with RW. PT will continue to follow pt acutely to progress mobility as tolerated and to ensure a safe d/c home. Pt would continue to benefit from skilled physical therapy services at this time while admitted and after d/c to address the below listed limitations in order to improve overall safety and independence with functional mobility.   Follow Up Recommendations  Home health PT;Supervision/Assistance - 24 hour     Equipment Recommendations  None recommended by PT    Recommendations for Other Services       Precautions / Restrictions Precautions Precautions: Fall;Knee Precaution Comments: reviewed positioning of LE following TKA Restrictions Weight Bearing Restrictions: Yes LLE Weight Bearing: Weight bearing as tolerated    Mobility  Bed Mobility Overal bed mobility: Needs Assistance Bed Mobility: Supine to Sit;Sit to Supine     Supine to sit: Min guard Sit to supine: Min guard   General bed mobility comments: increased time and effort, cueing for technique, pt able to use R LE to assist L LE onto bed  Transfers Overall transfer level: Needs assistance Equipment used: Rolling walker (2 wheeled) Transfers: Sit to/from Stand Sit to Stand: Min guard         General transfer comment: increased time, cueing for safe hand placement, min guard for safety x1 from EOB and x1 from toilet  Ambulation/Gait Ambulation/Gait assistance: Min guard Ambulation Distance (Feet): 100 Feet Assistive device: Rolling walker (2 wheeled) Gait Pattern/deviations: Step-to pattern;Step-through  pattern;Decreased step length - right;Decreased step length - left;Decreased stride length;Decreased weight shift to left;Antalgic Gait velocity: decreased Gait velocity interpretation: <1.31 ft/sec, indicative of household ambulator General Gait Details: pt with mild instability but no overt LOB or need for physical assistance, min guard for safety; pt guarded with ambulation and WB'ing on L LE; pt also with some genu valgus of L LE during stance phase   Stairs             Wheelchair Mobility    Modified Rankin (Stroke Patients Only)       Balance Overall balance assessment: Needs assistance Sitting-balance support: Feet supported Sitting balance-Leahy Scale: Good     Standing balance support: During functional activity;No upper extremity supported Standing balance-Leahy Scale: Fair                              Cognition Arousal/Alertness: Awake/alert Behavior During Therapy: WFL for tasks assessed/performed Overall Cognitive Status: Within Functional Limits for tasks assessed                                        Exercises Total Joint Exercises Quad Sets: AROM;Strengthening;Left;10 reps;Seated Long Arc Quad: AROM;Strengthening;Left;10 reps;Seated Knee Flexion: AROM;Strengthening;Left;10 reps;Seated Goniometric ROM: Flexion: ~90 degrees, Extension: lacking ~5 degrees to neutral; measured in sitting    General Comments        Pertinent Vitals/Pain Pain Assessment: Faces Faces Pain Scale: Hurts even more Pain Location: L knee Pain Descriptors / Indicators: Sore;Grimacing;Guarding Pain Intervention(s):  Monitored during session;Repositioned;Patient requesting pain meds-RN notified    Home Living                      Prior Function            PT Goals (current goals can now be found in the care plan section) Acute Rehab PT Goals PT Goal Formulation: With patient Time For Goal Achievement: 10/29/17 Potential to Achieve  Goals: Good Progress towards PT goals: Progressing toward goals    Frequency    7X/week      PT Plan Current plan remains appropriate    Co-evaluation              AM-PAC PT "6 Clicks" Daily Activity  Outcome Measure  Difficulty turning over in bed (including adjusting bedclothes, sheets and blankets)?: None Difficulty moving from lying on back to sitting on the side of the bed? : A Little Difficulty sitting down on and standing up from a chair with arms (e.g., wheelchair, bedside commode, etc,.)?: Unable Help needed moving to and from a bed to chair (including a wheelchair)?: None Help needed walking in hospital room?: A Little Help needed climbing 3-5 steps with a railing? : A Little 6 Click Score: 18    End of Session Equipment Utilized During Treatment: Gait belt Activity Tolerance: Patient tolerated treatment well Patient left: in bed;with call bell/phone within reach;with family/visitor present Nurse Communication: Mobility status PT Visit Diagnosis: Other abnormalities of gait and mobility (R26.89);Pain Pain - Right/Left: Left Pain - part of body: Knee     Time: 8527-7824 PT Time Calculation (min) (ACUTE ONLY): 34 min  Charges:  $Gait Training: 8-22 mins $Therapeutic Activity: 8-22 mins                    G Codes:       [place], [place], [place] 10/15/2017, 4:50 PM

## 2017-10-16 LAB — CBC
HEMATOCRIT: 31.9 % — AB (ref 39.0–52.0)
Hemoglobin: 10.4 g/dL — ABNORMAL LOW (ref 13.0–17.0)
MCH: 26.1 pg (ref 26.0–34.0)
MCHC: 32.6 g/dL (ref 30.0–36.0)
MCV: 80.2 fL (ref 78.0–100.0)
Platelets: 173 10*3/uL (ref 150–400)
RBC: 3.98 MIL/uL — ABNORMAL LOW (ref 4.22–5.81)
RDW: 15.4 % (ref 11.5–15.5)
WBC: 18.4 10*3/uL — ABNORMAL HIGH (ref 4.0–10.5)

## 2017-10-16 MED ORDER — CEPHALEXIN 500 MG PO CAPS: 500.0000 mg | ORAL_CAPSULE | Freq: Four times a day (QID) | ORAL | 0 refills | Status: DC
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
## 2017-10-16 NOTE — Care Management Note (Addendum)
Case Management Note  Patient Details  Name: Jesse Munoz MRN: [id number] Date of Birth: February 11, 1976  Subjective/Objective:    42 yr old gentleman s/p left total knee arthroplasty.                Action/Plan: Case manager spoke with patient and his wife concerning discharge plan. Patient was preoperatively setup with [facility], no changes. Patient's wife says their home is being renovated and they are staying at the [facility], [place] Rm[id number]. Her cell# is [phone]. Case manager will give this information to [employer].   Expected Discharge Date:  10/16/17               Expected Discharge Plan:  [place]  In-House Referral:  NA  Discharge planning Services  CM Consult  Post Acute Care Choice:  Home Health, Durable Medical Equipment Choice offered to:  Patient, Spouse  DME Arranged:  3-N-1, Walker rolling DME Agency:  [employer]  HH Arranged:  PT [place]:  [facility] [employer]  Status of Service:  Completed, signed off  If discussed at [employer], dates discussed:    Additional Comments:  Ninfa Meeker, RN 10/16/2017, 10:40 AM

## 2017-10-16 NOTE — Progress Notes (Signed)
Discharge instructions completed with pt and his wife.  Pt and his wife verbalized understanding of the information.  Pt alert and oriented x4.  Pt denies chest pain, shortness of breath, dizziness, lightheadedness, and n/v.  Pt discharged home.

## 2017-10-16 NOTE — Progress Notes (Signed)
Physical Therapy Treatment Patient Details Name: Jesse Munoz MRN: [id number] DOB: 30-Oct-1975 Today's Date: 10/16/2017    History of Present Illness Pt is a 42 y/o male s/p L TKA. PMH including but not limited to Bipolar disorder, PTSD, HIV, HTN, Schizophrenia, Stroke in 2016.    PT Comments    Pt with increased pain this session, limiting his functional mobility. He was still able to ambulate in hallway with min guard and RW but declining stair training this session. Pt currently staying in a hotel while his home is being renovated. His hotel room is on the ground level so he has no stairs to negotiate upon d/c. Plan is for pt to d/c home today. Pt is clear to d/c from PT perspective at this point.   Pt would continue to benefit from skilled physical therapy services at this time while admitted and after d/c to address the below listed limitations in order to improve overall safety and independence with functional mobility.    Follow Up Recommendations  Home health PT;Supervision/Assistance - 24 hour     Equipment Recommendations  None recommended by PT    Recommendations for Other Services       Precautions / Restrictions Precautions Precautions: Fall;Knee Precaution Comments: reviewed positioning of LE following TKA Restrictions Weight Bearing Restrictions: Yes LLE Weight Bearing: Weight bearing as tolerated    Mobility  Bed Mobility Overal bed mobility: Needs Assistance Bed Mobility: Supine to Sit     Supine to sit: Min guard     General bed mobility comments: increased time and effort, min guard for safety  Transfers Overall transfer level: Needs assistance Equipment used: Rolling walker (2 wheeled) Transfers: Sit to/from Stand Sit to Stand: Min guard         General transfer comment: increased time, good hand placement/technique, min guard for safety x1 from EOB and x1 from recliner chair  Ambulation/Gait Ambulation/Gait assistance: Min guard Ambulation  Distance (Feet): 75 Feet Assistive device: Rolling walker (2 wheeled) Gait Pattern/deviations: Step-to pattern;Step-through pattern;Decreased step length - right;Decreased step length - left;Decreased stride length;Decreased weight shift to left;Antalgic Gait velocity: decreased Gait velocity interpretation: <1.31 ft/sec, indicative of household ambulator General Gait Details: pt with mild instability but no overt LOB or need for physical assistance, min guard for safety; pt guarded with ambulation and WB'ing on L LE; pt also with some genu valgus of L LE during stance phase   Stairs Stairs: (pt declining stair training at this time)           Wheelchair Mobility    Modified Rankin (Stroke Patients Only)       Balance Overall balance assessment: Needs assistance Sitting-balance support: Feet supported Sitting balance-Leahy Scale: Good     Standing balance support: During functional activity;No upper extremity supported Standing balance-Leahy Scale: Fair                              Cognition Arousal/Alertness: Awake/alert Behavior During Therapy: WFL for tasks assessed/performed Overall Cognitive Status: Within Functional Limits for tasks assessed                                        Exercises Total Joint Exercises Ankle Circles/Pumps: AROM;Both;10 reps;Seated Heel Slides: AAROM;Left;10 reps;Seated Long Arc Quad: AROM;Strengthening;Left;10 reps;Seated Knee Flexion: AROM;Strengthening;Left;10 reps;Seated    General Comments  Pertinent Vitals/Pain Pain Assessment: Faces Faces Pain Scale: Hurts even more Pain Location: L knee Pain Descriptors / Indicators: Sore;Grimacing;Guarding Pain Intervention(s): Monitored during session;Repositioned    Home Living                      Prior Function            PT Goals (current goals can now be found in the care plan section) Acute Rehab PT Goals PT Goal Formulation:  With patient Time For Goal Achievement: 10/29/17 Potential to Achieve Goals: Good Progress towards PT goals: Progressing toward goals    Frequency    7X/week      PT Plan Current plan remains appropriate    Co-evaluation              AM-PAC PT "6 Clicks" Daily Activity  Outcome Measure  Difficulty turning over in bed (including adjusting bedclothes, sheets and blankets)?: None Difficulty moving from lying on back to sitting on the side of the bed? : A Little Difficulty sitting down on and standing up from a chair with arms (e.g., wheelchair, bedside commode, etc,.)?: Unable Help needed moving to and from a bed to chair (including a wheelchair)?: None Help needed walking in hospital room?: A Little Help needed climbing 3-5 steps with a railing? : A Little 6 Click Score: 18    End of Session Equipment Utilized During Treatment: Gait belt Activity Tolerance: Patient limited by pain Patient left: with call bell/phone within reach;in chair Nurse Communication: Mobility status PT Visit Diagnosis: Other abnormalities of gait and mobility (R26.89);Pain Pain - Right/Left: Left Pain - part of body: Knee     Time: 5638-9373 PT Time Calculation (min) (ACUTE ONLY): 21 min  Charges:  $Gait Training: 8-22 mins                    G Codes:       [place], [place], [place] [phone]    [place] 10/16/2017, 10:43 AM

## 2017-10-17 MED FILL — BIKTARVY 50-200-25 MG TABS: 50-200-25 | 30 days supply | Qty: 30 | Fill #1

## 2017-10-17 MED FILL — GABAPENTIN 300 MG CAPSULE: 300 | 30 days supply | Qty: 90 | Fill #1

## 2017-10-23 ENCOUNTER — Encounter: Payer: Self-pay | Admitting: Behavioral Health

## 2017-10-27 ENCOUNTER — Other Ambulatory Visit: Payer: Self-pay

## 2017-10-27 ENCOUNTER — Encounter (INDEPENDENT_AMBULATORY_CARE_PROVIDER_SITE_OTHER): Payer: Self-pay | Admitting: Nurse Practitioner

## 2017-10-27 ENCOUNTER — Ambulatory Visit (INDEPENDENT_AMBULATORY_CARE_PROVIDER_SITE_OTHER): Payer: Medicaid Other | Admitting: Nurse Practitioner

## 2017-10-27 VITALS — BP 154/97 | HR 101 | Temp 98.2°F | Ht 69.5 in | Wt 188.8 lb

## 2017-10-27 DIAGNOSIS — S065X5S Traumatic subdural hemorrhage with loss of consciousness greater than 24 hours with return to pre-existing conscious level, sequela: Secondary | ICD-10-CM

## 2017-10-27 DIAGNOSIS — F209 Schizophrenia, unspecified: Secondary | ICD-10-CM | POA: Diagnosis not present

## 2017-10-27 DIAGNOSIS — S065X5A Traumatic subdural hemorrhage with loss of consciousness greater than 24 hours with return to pre-existing conscious level, initial encounter: Secondary | ICD-10-CM | POA: Insufficient documentation

## 2017-10-27 DIAGNOSIS — F3181 Bipolar II disorder: Secondary | ICD-10-CM

## 2017-10-27 DIAGNOSIS — B20 Human immunodeficiency virus [HIV] disease: Secondary | ICD-10-CM | POA: Diagnosis not present

## 2017-10-27 DIAGNOSIS — K7460 Unspecified cirrhosis of liver: Secondary | ICD-10-CM | POA: Diagnosis not present

## 2017-10-27 DIAGNOSIS — F1190 Opioid use, unspecified, uncomplicated: Secondary | ICD-10-CM

## 2017-10-27 DIAGNOSIS — I10 Essential (primary) hypertension: Secondary | ICD-10-CM

## 2017-10-27 DIAGNOSIS — M25561 Pain in right knee: Secondary | ICD-10-CM

## 2017-10-27 DIAGNOSIS — G8929 Other chronic pain: Secondary | ICD-10-CM

## 2017-10-27 DIAGNOSIS — M25562 Pain in left knee: Secondary | ICD-10-CM

## 2017-10-27 DIAGNOSIS — F1120 Opioid dependence, uncomplicated: Secondary | ICD-10-CM | POA: Diagnosis not present

## 2017-10-27 DIAGNOSIS — F419 Anxiety disorder, unspecified: Secondary | ICD-10-CM

## 2017-10-27 DIAGNOSIS — M87052 Idiopathic aseptic necrosis of left femur: Secondary | ICD-10-CM

## 2017-10-27 MED ORDER — CITALOPRAM HYDROBROMIDE 20 MG PO TABS: 20.0000 mg | ORAL_TABLET | Freq: Every day | ORAL | 1 refills | Status: DC

## 2017-10-27 MED ORDER — CHLORTHALIDONE 50 MG PO TABS: 50.0000 mg | ORAL_TABLET | Freq: Every day | ORAL | 11 refills | Status: DC

## 2017-10-27 MED ORDER — GABAPENTIN 300 MG PO CAPS: 600.0000 mg | ORAL_CAPSULE | Freq: Three times a day (TID) | ORAL | 2 refills | Status: DC

## 2017-10-27 MED ORDER — LISINOPRIL 40 MG PO TABS: 40.0000 mg | ORAL_TABLET | Freq: Every day | ORAL | 11 refills | Status: DC

## 2017-10-27 MED ORDER — GABAPENTIN 300 MG PO CAPS: 300.0000 mg | ORAL_CAPSULE | Freq: Three times a day (TID) | ORAL | 1 refills | Status: DC

## 2017-10-27 MED ORDER — QUETIAPINE FUMARATE 50 MG PO TABS: 50.0000 mg | ORAL_TABLET | Freq: Every day | ORAL | 1 refills | Status: DC

## 2017-10-27 MED ORDER — PROMETHAZINE HCL 25 MG PO TABS: 25.0000 mg | ORAL_TABLET | Freq: Three times a day (TID) | ORAL | 3 refills | Status: DC | PRN

## 2017-10-27 MED FILL — CHLORTHALIDONE 50 MG TABS: 50 | 30 days supply | Qty: 30 | Fill #0

## 2017-10-27 MED FILL — LISINOPRIL 40 MG TABLET: 40 | 30 days supply | Qty: 30 | Fill #0

## 2017-10-27 MED FILL — QUETIAPINE FUMARATE 50 MG T: 50 | 30 days supply | Qty: 30 | Fill #0

## 2017-10-27 MED FILL — oxyCODONE HCL 10 MG TABS: 10 | 8 days supply | Qty: 50 | Fill #0

## 2017-10-27 MED FILL — PROMETHAZINE 25 MG TABLET: 25 | 10 days supply | Qty: 30 | Fill #0

## 2017-10-27 MED FILL — CITALOPRAM HBR 20 MG TABLET: 20 | 30 days supply | Qty: 30 | Fill #0
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
## 2017-10-27 NOTE — Patient Instructions (Signed)
Total Knee Replacement, Care After These instructions give you information about caring for yourself after your procedure. Your doctor may also give you more specific instructions. Call your doctor if you have any problems or questions after your procedure. Follow these instructions at home: Medicines  Take over-the-counter and prescription medicines only as told by your doctor.  If you were prescribed an antibiotic medicine, take it as told by your doctor. Do not stop taking the antibiotic even if you start to feel better.  If you were prescribed a blood thinner (anticoagulant), take it as told by your doctor. If you have a splint or brace:  Wear the splint or brace as told by your doctor. Remove it only as told by your doctor.  Loosen the splint or brace if your toes tingle, get numb, or turn cold and blue.  Do not let your splint or brace get wet if it is not waterproof.  Keep the splint or brace clean. Bathing   Do not take baths, swim, or use a hot tub until your doctor says it is okay. Ask your doctor if you can take showers. You may only be allowed to take sponge baths for bathing.  If you have a splint or brace that is not waterproof, cover it with a watertight covering when you take a bath or a shower.  Keep your bandage (dressing) dry until your doctor says it can be taken off. Incision care and drain care  Check your cut from surgery (incision) and your drain every day for signs of infection. Check for: ? More redness, swelling, or pain. ? More fluid or blood. ? Warmth. ? Pus or a bad smell.  Follow instructions from your doctor about how to take care of your cut from surgery. Make sure you: ? Wash your hands with soap and water before you change your bandage. If you cannot use soap and water, use hand sanitizer. ? Change your bandage as told by your doctor. ? Leave stitches (sutures), skin glue, or skin tape (adhesive) strips in place. They may need to stay in place  for 2 weeks or longer. If tape strips get loose and curl up, you may trim the loose edges. Do not remove tape strips completely unless your doctor says it is okay.  If you have a drain, follow instructions from your doctor about caring for it. Do not remove the drain tube or any bandages unless your doctor says it is okay. Managing pain, stiffness, and swelling   If directed, put ice on your knee. ? Put ice in a plastic bag. ? Place a towel between your skin and the bag. ? Leave the ice on for 20 minutes, 2-3 times per day.  If directed, apply heat to the affected area as often as told by your doctor. Use the heat source that your doctor recommends, such as a moist heat pack or a heating pad. ? Place a towel between your skin and the heat source. ? Leave the heat on for 20-30 minutes. ? Remove the heat if your skin turns bright red. This is especially important if you are unable to feel pain, heat, or cold. You may have a greater risk of getting burned.  Move your toes often to avoid stiffness and to lessen swelling.  Raise (elevate) your knee above the level of your heart while you are sitting or lying down.  Wear elastic knee support for as long as told by your doctor. Driving   Do not   drive until your doctor says it is okay. Ask your doctor when it is safe to drive if you have a splint or brace on your knee.  Do not drive or use heavy machinery while taking prescription pain medicine.  Do not drive for 24 hours if you received a sedative. Activity  Do not lift anything that is heavier than 10 lb (4.5 kg) until your doctor says it is okay.  Do not play contact sports until your doctor says it is okay.  Avoid high-impact activities, including running, jumping rope, and jumping jacks.  Avoid sitting for a long time without moving. Get up and move around at least every few hours.  If physical therapy was prescribed, do exercises as told by your doctor.  Return to your normal  activities as told by your doctor. Ask your doctor what activities are safe for you. Safety  Do not use your leg to support your body weight until your doctor says that you can. Use crutches or a walker as told by your doctor. General instructions   Do not have any dental work done for at least 3 months after your surgery. When you do have dental work done, tell your dentist about your joint replacement.  Do not use any tobacco products, such as cigarettes, chewing tobacco, or e-cigarettes. If you need help quitting, ask your doctor.  Wear special socks (compression stockings) as told by your doctor.  If you have been sent home with a knee joint motion machine (continuous passive motion machine), use it as told by your doctor.  Drink enough fluid to keep your pee (urine) clear or pale yellow.  If you have been told to lose weight, follow instructions from your doctor about how to do this safely.  Keep all follow-up visits as told by your doctor. This is important. Contact a doctor if:  You have more redness, swelling, or pain around your cut from surgery or your drain.  You have more fluid or blood coming from your cut from surgery or your drain.  Your cut from surgery or your drain area feels warm to the touch.  You have pus or a bad smell coming from your cut from surgery or your drain.  You have a fever.  Your cut breaks open after your doctor removes your stitches, skin glue, or skin tape strips.  Your new joint feels loose.  You have knee pain that does not go away. Get help right away if:  You have a rash.  You have pain in your calf or thigh.  You have swelling in your calf or thigh.  You have shortness of breath.  You have trouble breathing.  You have chest pain.  Your ability to move your knee is getting worse. This information is not intended to replace advice given to you by your health care provider. Make sure you discuss any questions you have with  your health care provider. Document Released: 08/26/2011 Document Revised: 02/05/2016 Document Reviewed: 05/10/2015 [employer] Interactive Patient Education  2017 [employer]  

## 2017-11-03 ENCOUNTER — Encounter: Payer: Self-pay | Admitting: Family

## 2017-11-05 ENCOUNTER — Ambulatory Visit: Payer: Medicaid Other | Attending: Nurse Practitioner | Admitting: Physical Therapy

## 2017-11-11 ENCOUNTER — Ambulatory Visit: Payer: Medicaid Other

## 2017-11-17 ENCOUNTER — Other Ambulatory Visit: Payer: Self-pay | Admitting: Family

## 2017-11-18 MED FILL — GABAPENTIN 300 MG CAPSULE: 300 | 30 days supply | Qty: 180 | Fill #0

## 2017-11-18 MED FILL — BIKTARVY 50-200-25 MG TABS: 50-200-25 | 30 days supply | Qty: 30 | Fill #0

## 2017-11-20 MED FILL — QUETIAPINE FUMARATE 50 MG T: 50 | 30 days supply | Qty: 30 | Fill #1

## 2017-11-20 MED FILL — CITALOPRAM HBR 20 MG TABLET: 20 | 30 days supply | Qty: 30 | Fill #1

## 2017-11-20 MED FILL — PROMETHAZINE 25 MG TABLET: 25 | 10 days supply | Qty: 30 | Fill #1

## 2017-11-20 MED FILL — LISINOPRIL 40 MG TABLET: 40 | 30 days supply | Qty: 30 | Fill #1

## 2017-12-05 MED FILL — traMADol HCL 50 MG TABS: 50 | 7 days supply | Qty: 30 | Fill #0

## 2017-12-17 ENCOUNTER — Other Ambulatory Visit (INDEPENDENT_AMBULATORY_CARE_PROVIDER_SITE_OTHER): Payer: Self-pay | Admitting: Nurse Practitioner

## 2017-12-17 DIAGNOSIS — F209 Schizophrenia, unspecified: Secondary | ICD-10-CM

## 2017-12-17 MED FILL — PROMETHAZINE 25 MG TABLET: 25 | 10 days supply | Qty: 30 | Fill #2

## 2017-12-17 MED FILL — LISINOPRIL 40 MG TABLET: 40 | 30 days supply | Qty: 30 | Fill #2

## 2017-12-17 MED FILL — QUETIAPINE FUMARATE 50 MG T: 50 | 30 days supply | Qty: 30 | Fill #0

## 2017-12-17 MED FILL — GABAPENTIN 300 MG CAPSULE: 300 | 30 days supply | Qty: 180 | Fill #1

## 2017-12-17 MED FILL — BIKTARVY 50-200-25 MG TABS: 50-200-25 | 30 days supply | Qty: 30 | Fill #1

## 2018-01-12 ENCOUNTER — Other Ambulatory Visit: Payer: Self-pay | Admitting: Family

## 2018-01-12 ENCOUNTER — Other Ambulatory Visit (INDEPENDENT_AMBULATORY_CARE_PROVIDER_SITE_OTHER): Payer: Self-pay | Admitting: Nurse Practitioner

## 2018-01-12 DIAGNOSIS — F419 Anxiety disorder, unspecified: Secondary | ICD-10-CM

## 2018-01-12 DIAGNOSIS — F3181 Bipolar II disorder: Secondary | ICD-10-CM

## 2018-01-12 MED FILL — PROMETHAZINE 25 MG TABLET: 25 | 10 days supply | Qty: 30 | Fill #3

## 2018-01-12 MED FILL — CITALOPRAM HBR 20 MG TABLET: 20 | 30 days supply | Qty: 30 | Fill #0

## 2018-01-12 MED FILL — GABAPENTIN 300 MG CAPSULE: 300 | 30 days supply | Qty: 180 | Fill #2

## 2018-01-19 ENCOUNTER — Ambulatory Visit: Payer: Medicaid Other | Admitting: Family

## 2018-01-27 ENCOUNTER — Ambulatory Visit (INDEPENDENT_AMBULATORY_CARE_PROVIDER_SITE_OTHER): Payer: Medicaid Other | Admitting: Physician Assistant

## 2018-02-05 ENCOUNTER — Other Ambulatory Visit (INDEPENDENT_AMBULATORY_CARE_PROVIDER_SITE_OTHER): Payer: Self-pay | Admitting: Nurse Practitioner

## 2018-02-05 ENCOUNTER — Ambulatory Visit (INDEPENDENT_AMBULATORY_CARE_PROVIDER_SITE_OTHER): Payer: Medicaid Other | Admitting: Physician Assistant

## 2018-02-05 DIAGNOSIS — M87052 Idiopathic aseptic necrosis of left femur: Secondary | ICD-10-CM

## 2018-02-05 MED FILL — CITALOPRAM HBR 20 MG TABLET: 20 | 30 days supply | Qty: 30 | Fill #1

## 2018-02-05 MED FILL — QUETIAPINE FUMARATE 50 MG T: 50 | 30 days supply | Qty: 30 | Fill #1

## 2018-02-05 MED FILL — BIKTARVY 50-200-25 MG TABS: 50-200-25 | 30 days supply | Qty: 30 | Fill #0

## 2018-02-05 MED FILL — LISINOPRIL 40 MG TABLET: 40 | 30 days supply | Qty: 30 | Fill #3

## 2018-03-04 MED FILL — BIKTARVY 50-200-25 MG TABS: 50-200-25 | 30 days supply | Qty: 30 | Fill #1

## 2018-03-04 MED FILL — CITALOPRAM HBR 20 MG TABLET: 20 | 30 days supply | Qty: 30 | Fill #2

## 2018-03-04 MED FILL — QUETIAPINE FUMARATE 50 MG T: 50 | 30 days supply | Qty: 30 | Fill #2

## 2018-03-04 MED FILL — LISINOPRIL 40 MG TABLET: 40 | 30 days supply | Qty: 30 | Fill #4

## 2018-03-10 ENCOUNTER — Encounter (INDEPENDENT_AMBULATORY_CARE_PROVIDER_SITE_OTHER): Payer: Self-pay | Admitting: Physician Assistant

## 2018-03-10 ENCOUNTER — Ambulatory Visit (INDEPENDENT_AMBULATORY_CARE_PROVIDER_SITE_OTHER): Payer: Medicaid Other | Admitting: Physician Assistant

## 2018-03-10 ENCOUNTER — Other Ambulatory Visit: Payer: Self-pay

## 2018-03-10 VITALS — BP 119/69 | HR 96 | Temp 97.8°F | Ht 69.5 in | Wt 179.2 lb

## 2018-03-10 DIAGNOSIS — M25561 Pain in right knee: Secondary | ICD-10-CM | POA: Diagnosis not present

## 2018-03-10 DIAGNOSIS — F419 Anxiety disorder, unspecified: Secondary | ICD-10-CM

## 2018-03-10 DIAGNOSIS — M25562 Pain in left knee: Secondary | ICD-10-CM

## 2018-03-10 DIAGNOSIS — L989 Disorder of the skin and subcutaneous tissue, unspecified: Secondary | ICD-10-CM | POA: Diagnosis not present

## 2018-03-10 DIAGNOSIS — R112 Nausea with vomiting, unspecified: Secondary | ICD-10-CM | POA: Diagnosis not present

## 2018-03-10 DIAGNOSIS — Z23 Encounter for immunization: Secondary | ICD-10-CM

## 2018-03-10 DIAGNOSIS — G8929 Other chronic pain: Secondary | ICD-10-CM

## 2018-03-10 DIAGNOSIS — Z760 Encounter for issue of repeat prescription: Secondary | ICD-10-CM

## 2018-03-10 DIAGNOSIS — I10 Essential (primary) hypertension: Secondary | ICD-10-CM

## 2018-03-10 MED ORDER — MECLIZINE HCL 25 MG PO TABS: 25.0000 mg | ORAL_TABLET | Freq: Two times a day (BID) | ORAL | 3 refills | Status: DC

## 2018-03-10 MED ORDER — ACETAMINOPHEN-CODEINE #3 300-30 MG PO TABS: 2.0000 | ORAL_TABLET | Freq: Three times a day (TID) | ORAL | 0 refills | Status: DC | PRN

## 2018-03-10 MED ORDER — SULFAMETHOXAZOLE-TRIMETHOPRIM 400-80 MG PO TABS: 1.0000 | ORAL_TABLET | Freq: Two times a day (BID) | ORAL | 0 refills | Status: DC

## 2018-03-10 MED ORDER — CHLORTHALIDONE 50 MG PO TABS: 50.0000 mg | ORAL_TABLET | Freq: Every day | ORAL | 6 refills | Status: DC

## 2018-03-10 MED ORDER — LISINOPRIL 40 MG PO TABS: 40.0000 mg | ORAL_TABLET | Freq: Every day | ORAL | 6 refills | Status: DC

## 2018-03-10 MED ORDER — PROMETHAZINE HCL 25 MG PO TABS: 25.0000 mg | ORAL_TABLET | Freq: Three times a day (TID) | ORAL | 3 refills | Status: DC | PRN

## 2018-03-10 MED ORDER — CITALOPRAM HYDROBROMIDE 40 MG PO TABS: 40.0000 mg | ORAL_TABLET | Freq: Every day | ORAL | 5 refills | Status: DC

## 2018-03-10 MED ORDER — QUETIAPINE FUMARATE 50 MG PO TABS: 50.0000 mg | ORAL_TABLET | Freq: Every day | ORAL | 5 refills | Status: DC

## 2018-03-10 MED ORDER — GABAPENTIN 300 MG PO CAPS: 600.0000 mg | ORAL_CAPSULE | Freq: Three times a day (TID) | ORAL | 5 refills | Status: DC

## 2018-03-10 MED FILL — GABAPENTIN 300 MG CAPSULE: 300 | 30 days supply | Qty: 180 | Fill #0

## 2018-03-10 MED FILL — CHLORTHALIDONE 50 MG TABS: 50 | 30 days supply | Qty: 30 | Fill #0

## 2018-03-10 MED FILL — SULFAMETHOXAZOLE-TMP SS TAB: 400-80 | 10 days supply | Qty: 20 | Fill #0

## 2018-03-10 MED FILL — MECLIZINE 25 MG TABLET: 25 | 30 days supply | Qty: 60 | Fill #0

## 2018-03-10 MED FILL — CITALOPRAM HBR 40 MG TABLET: 40 | 30 days supply | Qty: 30 | Fill #0

## 2018-03-10 MED FILL — PROMETHAZINE 25 MG TABLET: 25 | 10 days supply | Qty: 30 | Fill #0

## 2018-03-10 NOTE — Patient Instructions (Signed)
Community Resources  Advocacy/Legal Legal Aid [place]:  1-866-219-5262  /  336-272-0148  Family Justice Center:  336-641-7233  [facility] 24-hr Crisis line:  336-273-7273  Women's Resource Center, [place]:  336-275-6090  Court Watch (custody):  336-275-2346  [place] [facility]:   336-279-9299    Baby & Breastfeeding Car Seat Inspection @ Various [place] Fire Depts.- call 336-373-2177  [place] [facility]  336-832-6860  [facility] 336-878-6712  WIC: 336-641-3663 ([place]);  336-641-7571 ([place])  La Leche League:  1-877-452-5321   Childcare [place] [facility]: 336-369-5097 ([place]) / 336-887-8224 ([place])  - Child Care Resources/ Referrals/ Scholarships  - Head Start/ Early Head Start (call or apply online)  [place] DHHS: [place] Pre-K :  1-800-859-0829 / 336-274-5437   Employment / Job Search [facility] [place]: 336-275-6090 / [address]  [place] [employer] (JobLink): 336-373-5922 ([place]) / 336-882-4141 ([place])  [facility]/ Career Center: 336-275-9801 / 336-282-7307  [facility] Job [facility] Career Center: 336-373-3764  DHHS Work First: 336-641-3447 ([place]) / 336-641-3447 ([place])  [facility] [place]:  336-676-5871   Financial Assistance [facility] [facility]:  336-553-2657  [employer]: 336-235-0368  [employer] (furniture):  336-370-4002  [place] [facility]: 336-373-4264  Low Income Energy Assistance  336-641-3000   Food Assistance DHHS- SNAP/ Food Stamps: 336-641-4588  WIC: [place]- 336-641-3663 ;  [place] 336-641-7571  Little Green Book- Free Meals  Little Blue Book- Free Food Pantries  During the summer, text "FOOD" to 877877   General Health / Clinics (Adults) Orange Card (for Adults) through [facility]: (336) 895-4900  [facility]:   336-832-8035  [facility]:   336-832-4444  Health Department:  336-641-3245  [facility]:  336-415-3877 / 336-641-2100  [facility] [place]:   336-373-0678  [facility]:   336-334-4822 x 50251   Housing [place] [facility]:   336-691-9521  [place] Housing Authority:  336-275-8501  Affordable Housing Managemnt:  336-273-0568   Immigrant/ Refugee Center for [place]ians (UNCG):  336-256-1065  [facility]:  336-379-0037  [employer] [facility]:  336-937-4701  [employer]:  336-617-0381  African Services Coalition:  336-574-2677   LGBTQ YouthSAFE  www.youthsafe[place].org  PFLAG  336-541-6754 / info@[place].org  The Trevor Project:  1-866-488-7386   Mental Health/ Substance Use [facility]  336-387-6161  [facility]:  336-832-9700 or 1-800-711-2635  [facility] of Care:  336-271-5888  [facility]:  336-294-1349  [facility]:  336-542-2884  [facility] (walk-ins)  336-676-6840 / [address]  [place]:  800-449-1287  Alcoholics Anonymous:  336-854-4278  Narcotics Anonymous:  800-365-1036  Quit Smoking Hotline:  800-QUIT-NOW (800-784-8669)   Parenting Children's Home Society:  800-632-1400  [place]: Education Center & Support Groups:  336-832-6682  YWCA: 336-273-3461  [employer]CG: Bringing Out the Best:  336-334-3120               Thriving at Three (Hispanic families): 336-256-1066  Healthy Start ([facility]):  336-387-6161 x2288  Parents as Teachers:  336-691-0024  [place] [facility]- Learning Together (Immigrants): 336-369-5001   Poison Control 800-222-1222  Sports & Recreation [employer] Open Doors Application: ymcanwnc.org/join/open-doors-financial-assistance/  City of [place] Recreation Centers: http://www.[place].gov/index.aspx?page=3615   Special Needs Family Support Network:  336-832-6507  Autism Society of [place]:   336-333-0197 x1402 or x1412 /  800-785-1035  [employer]CH :  336-334-5773     ARC of [place]:  336-373-1076  Children's Developmental Service Agency (CDSA):  336-334-5601  CC4C (Care Coordination for Children):  336-641-7641   Transportation Medicaid Transportation: 336-641-4848 to apply  [place] Transit Authority: 336-335-6499 (reduced-fare bus ID to Medicaid/ Medicare/ Orange Card)  SCAT Paratransit services: Eligible riders only, call 336-333-6589 for application   Tutoring/Mentoring [facility] Child Development Institute: 336-230-2138  Big Brothers/ Big Sisters: 336-378-9100 ([place])  336-882-4167 ([place])  ACES through child's school: 336-370-2321  [employer] Achievers: contact your local Y  SHIELD Mentor Program: 336-337-2771   
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
## 2018-03-11 ENCOUNTER — Other Ambulatory Visit (INDEPENDENT_AMBULATORY_CARE_PROVIDER_SITE_OTHER): Payer: Self-pay | Admitting: Physician Assistant

## 2018-03-11 LAB — CBC WITH DIFFERENTIAL/PLATELET
BASOS ABS: 0 10*3/uL (ref 0.0–0.2)
Basos: 0 %
EOS (ABSOLUTE): 0.1 10*3/uL (ref 0.0–0.4)
Eos: 1 %
Hematocrit: 43.6 % (ref 37.5–51.0)
Hemoglobin: 13.7 g/dL (ref 13.0–17.7)
IMMATURE GRANS (ABS): 0 10*3/uL (ref 0.0–0.1)
Immature Granulocytes: 0 %
LYMPHS: 53 %
Lymphocytes Absolute: 5 10*3/uL — ABNORMAL HIGH (ref 0.7–3.1)
MCH: 25.2 pg — ABNORMAL LOW (ref 26.6–33.0)
MCHC: 31.4 g/dL — ABNORMAL LOW (ref 31.5–35.7)
MCV: 80 fL (ref 79–97)
Monocytes Absolute: 0.6 10*3/uL (ref 0.1–0.9)
Monocytes: 6 %
NEUTROS PCT: 40 %
Neutrophils Absolute: 3.8 10*3/uL (ref 1.4–7.0)
PLATELETS: 175 10*3/uL (ref 150–450)
RBC: 5.44 x10E6/uL (ref 4.14–5.80)
RDW: 17.9 % — AB (ref 12.3–15.4)
WBC: 9.5 10*3/uL (ref 3.4–10.8)

## 2018-03-11 LAB — LIPID PANEL
CHOLESTEROL TOTAL: 194 mg/dL (ref 100–199)
Chol/HDL Ratio: 4 ratio (ref 0.0–5.0)
HDL: 49 mg/dL (ref >39)
LDL CALC: 106 mg/dL — AB (ref 0–99)
TRIGLYCERIDES: 193 mg/dL — AB (ref 0–149)
VLDL Cholesterol Cal: 39 mg/dL (ref 5–40)

## 2018-03-11 LAB — COMPREHENSIVE METABOLIC PANEL
ALT: 11 IU/L (ref 0–44)
AST: 21 IU/L (ref 0–40)
Albumin/Globulin Ratio: 1.4 (ref 1.2–2.2)
Albumin: 4.6 g/dL (ref 3.5–5.5)
Alkaline Phosphatase: 121 IU/L — ABNORMAL HIGH (ref 39–117)
BUN/Creatinine Ratio: 11 (ref 9–20)
BUN: 9 mg/dL (ref 6–24)
Bilirubin Total: 0.3 mg/dL (ref 0.0–1.2)
CALCIUM: 9.4 mg/dL (ref 8.7–10.2)
CO2: 24 mmol/L (ref 20–29)
Chloride: 101 mmol/L (ref 96–106)
Creatinine, Ser: 0.79 mg/dL (ref 0.76–1.27)
GFR calc Af Amer: 126 mL/min/{1.73_m2} (ref >59)
GFR, EST NON AFRICAN AMERICAN: 109 mL/min/{1.73_m2} (ref >59)
GLUCOSE: 68 mg/dL (ref 65–99)
Globulin, Total: 3.3 g/dL (ref 1.5–4.5)
Potassium: 3.6 mmol/L (ref 3.5–5.2)
Sodium: 143 mmol/L (ref 134–144)
TOTAL PROTEIN: 7.9 g/dL (ref 6.0–8.5)

## 2018-03-11 MED ORDER — ATORVASTATIN CALCIUM 40 MG PO TABS: 40.0000 mg | ORAL_TABLET | Freq: Every day | ORAL | 1 refills | Status: DC

## 2018-03-12 MED FILL — ATORVASTATIN 40 MG TABLET: 40 | 30 days supply | Qty: 30 | Fill #0

## 2018-03-13 ENCOUNTER — Telehealth (INDEPENDENT_AMBULATORY_CARE_PROVIDER_SITE_OTHER): Payer: Self-pay

## 2018-03-13 NOTE — Telephone Encounter (Signed)
Left message with a young woman to ask patient to call [facility]. Nat Christen, CMA

## 2018-03-13 NOTE — Telephone Encounter (Signed)
-----   Message from Clent Demark, PA-C sent at 03/11/2018  6:20 PM EDT ----- Cholesterol is elevated. Please reduce the amount of sweets and fats in the diet. I have sent atorvastatin 40 mg to [facility].

## 2018-03-16 ENCOUNTER — Ambulatory Visit (HOSPITAL_COMMUNITY): Payer: Medicaid Other | Admitting: Psychiatry

## 2018-03-17 ENCOUNTER — Telehealth (INDEPENDENT_AMBULATORY_CARE_PROVIDER_SITE_OTHER): Payer: Self-pay

## 2018-03-17 ENCOUNTER — Encounter (INDEPENDENT_AMBULATORY_CARE_PROVIDER_SITE_OTHER): Payer: Self-pay

## 2018-03-17 NOTE — Telephone Encounter (Signed)
-----   Message from Clent Demark, PA-C sent at 03/11/2018  6:20 PM EDT ----- Cholesterol is elevated. Please reduce the amount of sweets and fats in the diet. I have sent atorvastatin 40 mg to [facility].

## 2018-03-17 NOTE — Telephone Encounter (Signed)
Left message asking patient to return call to [facility] at [phone]. Results mailed. Nat Christen, CMA

## 2018-03-20 MED FILL — ACETAMINOPHEN/COD #3 TABLET: 300-30 | 20 days supply | Qty: 120 | Fill #0

## 2018-04-01 ENCOUNTER — Other Ambulatory Visit: Payer: Self-pay | Admitting: Family

## 2018-04-06 MED FILL — MECLIZINE 25 MG TABLET: 25 | 30 days supply | Qty: 60 | Fill #1

## 2018-04-06 MED FILL — CITALOPRAM HBR 40 MG TABLET: 40 | 30 days supply | Qty: 30 | Fill #1

## 2018-04-06 MED FILL — GABAPENTIN 300 MG CAPSULE: 300 | 30 days supply | Qty: 180 | Fill #1

## 2018-04-06 MED FILL — BIKTARVY 50-200-25 MG TABS: 50-200-25 | 30 days supply | Qty: 30 | Fill #0

## 2018-04-06 MED FILL — CHLORTHALIDONE 50 MG TABS: 50 | 30 days supply | Qty: 30 | Fill #1

## 2018-04-06 MED FILL — QUETIAPINE FUMARATE 50 MG T: 50 | 30 days supply | Qty: 30 | Fill #0

## 2018-04-06 MED FILL — ATORVASTATIN 40 MG TABLET: 40 | 30 days supply | Qty: 30 | Fill #1

## 2018-04-06 MED FILL — LISINOPRIL 40 MG TABLET: 40 | 30 days supply | Qty: 30 | Fill #5

## 2018-04-30 ENCOUNTER — Other Ambulatory Visit: Payer: Self-pay | Admitting: Family

## 2018-04-30 ENCOUNTER — Other Ambulatory Visit (INDEPENDENT_AMBULATORY_CARE_PROVIDER_SITE_OTHER): Payer: Self-pay | Admitting: Physician Assistant

## 2018-04-30 DIAGNOSIS — G8929 Other chronic pain: Secondary | ICD-10-CM

## 2018-04-30 DIAGNOSIS — M25562 Pain in left knee: Principal | ICD-10-CM

## 2018-04-30 DIAGNOSIS — M25561 Pain in right knee: Secondary | ICD-10-CM

## 2018-04-30 NOTE — Telephone Encounter (Signed)
FWD to PCP. Jesse Munoz Jesse Munoz, CMA  

## 2018-05-04 MED FILL — QUETIAPINE FUMARATE 50 MG T: 50 | 30 days supply | Qty: 30 | Fill #1

## 2018-05-04 MED FILL — CITALOPRAM HBR 40 MG TABLET: 40 | 30 days supply | Qty: 30 | Fill #2

## 2018-05-04 MED FILL — LISINOPRIL 40 MG TABLET: 40 | 30 days supply | Qty: 30 | Fill #6

## 2018-05-04 MED FILL — ATORVASTATIN 40 MG TABLET: 40 | 30 days supply | Qty: 30 | Fill #2

## 2018-05-04 MED FILL — BIKTARVY 50-200-25 MG TABS: 50-200-25 | 30 days supply | Qty: 30 | Fill #0

## 2018-05-04 MED FILL — GABAPENTIN 300 MG CAPSULE: 300 | 30 days supply | Qty: 180 | Fill #2

## 2018-05-04 MED FILL — MECLIZINE 25 MG TABLET: 25 | 30 days supply | Qty: 60 | Fill #2

## 2018-05-04 MED FILL — CHLORTHALIDONE 50 MG TABS: 50 | 30 days supply | Qty: 30 | Fill #2

## 2018-05-05 MED FILL — PROMETHAZINE 25 MG TABLET: 25 | 10 days supply | Qty: 30 | Fill #1

## 2018-05-08 ENCOUNTER — Ambulatory Visit (INDEPENDENT_AMBULATORY_CARE_PROVIDER_SITE_OTHER): Payer: Medicaid Other | Admitting: Psychiatry

## 2018-05-08 ENCOUNTER — Encounter (HOSPITAL_COMMUNITY): Payer: Self-pay | Admitting: Psychiatry

## 2018-05-08 VITALS — BP 126/74 | Ht 69.5 in | Wt 191.0 lb

## 2018-05-08 DIAGNOSIS — F4310 Post-traumatic stress disorder, unspecified: Secondary | ICD-10-CM

## 2018-05-08 DIAGNOSIS — F319 Bipolar disorder, unspecified: Secondary | ICD-10-CM | POA: Diagnosis not present

## 2018-05-08 DIAGNOSIS — F1010 Alcohol abuse, uncomplicated: Secondary | ICD-10-CM

## 2018-05-08 DIAGNOSIS — F200 Paranoid schizophrenia: Secondary | ICD-10-CM
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
## 2018-05-26 ENCOUNTER — Other Ambulatory Visit: Payer: Self-pay | Admitting: Family

## 2018-05-26 ENCOUNTER — Telehealth: Payer: Self-pay

## 2018-05-26 NOTE — Telephone Encounter (Signed)
Attempted to call patient to schedule an appointment with our office. Patient was last seen by Terri Piedra, Np 10/08/17. Was supposed to schedule a pharmacy visit in one month, and follow-up appointment with Terri Piedra, Np in 3.  Called made was answered by a male who states patient is not in. Asked to leave message for patient to call office back. Patient needs to schedule an appointment to return to care with our office. [place]

## 2018-06-01 MED FILL — CHLORTHALIDONE 50 MG TABS: 50 | 30 days supply | Qty: 30 | Fill #3

## 2018-06-01 MED FILL — QUETIAPINE FUMARATE 50 MG T: 50 | 30 days supply | Qty: 30 | Fill #2

## 2018-06-01 MED FILL — ATORVASTATIN 40 MG TABLET: 40 | 30 days supply | Qty: 30 | Fill #3

## 2018-06-01 MED FILL — LISINOPRIL 40 MG TABLET: 40 | 30 days supply | Qty: 30 | Fill #7

## 2018-06-01 MED FILL — CITALOPRAM HBR 40 MG TABLET: 40 | 30 days supply | Qty: 30 | Fill #3

## 2018-06-01 MED FILL — MECLIZINE 25 MG TABLET: 25 | 30 days supply | Qty: 60 | Fill #3

## 2018-06-01 MED FILL — GABAPENTIN 300 MG CAPSULE: 300 | 30 days supply | Qty: 180 | Fill #3

## 2018-06-01 MED FILL — PROMETHAZINE 25 MG TABLET: 25 | 10 days supply | Qty: 30 | Fill #2

## 2018-06-02 ENCOUNTER — Ambulatory Visit (INDEPENDENT_AMBULATORY_CARE_PROVIDER_SITE_OTHER): Payer: Medicaid Other | Admitting: Infectious Diseases

## 2018-06-02 ENCOUNTER — Encounter: Payer: Self-pay | Admitting: Infectious Diseases

## 2018-06-02 VITALS — BP 137/84 | HR 102 | Temp 98.3°F | Wt 194.0 lb

## 2018-06-02 DIAGNOSIS — B20 Human immunodeficiency virus [HIV] disease: Secondary | ICD-10-CM | POA: Diagnosis not present

## 2018-06-02 DIAGNOSIS — M25561 Pain in right knee: Secondary | ICD-10-CM

## 2018-06-02 DIAGNOSIS — Z96652 Presence of left artificial knee joint: Secondary | ICD-10-CM | POA: Diagnosis not present

## 2018-06-02 DIAGNOSIS — Z881 Allergy status to other antibiotic agents status: Secondary | ICD-10-CM

## 2018-06-02 DIAGNOSIS — F17210 Nicotine dependence, cigarettes, uncomplicated: Secondary | ICD-10-CM

## 2018-06-02 DIAGNOSIS — Z79899 Other long term (current) drug therapy: Secondary | ICD-10-CM

## 2018-06-02 DIAGNOSIS — R269 Unspecified abnormalities of gait and mobility: Secondary | ICD-10-CM | POA: Diagnosis not present

## 2018-06-02 DIAGNOSIS — G8929 Other chronic pain: Secondary | ICD-10-CM

## 2018-06-02 DIAGNOSIS — M25562 Pain in left knee: Secondary | ICD-10-CM

## 2018-06-02 DIAGNOSIS — R21 Rash and other nonspecific skin eruption: Secondary | ICD-10-CM | POA: Diagnosis not present

## 2018-06-02 DIAGNOSIS — M7989 Other specified soft tissue disorders: Secondary | ICD-10-CM

## 2018-06-02 DIAGNOSIS — L209 Atopic dermatitis, unspecified: Secondary | ICD-10-CM

## 2018-06-02 MED ORDER — BICTEGRAVIR-EMTRICITAB-TENOFOV 50-200-25 MG PO TABS: 1.0000 | ORAL_TABLET | Freq: Every day | ORAL | 5 refills | Status: DC

## 2018-06-02 MED ORDER — TRIAMCINOLONE ACETONIDE 0.1 % EX LOTN: 1.0000 "application " | TOPICAL_LOTION | Freq: Three times a day (TID) | CUTANEOUS | 5 refills | Status: DC
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
## 2018-06-02 NOTE — Assessment & Plan Note (Addendum)
Well controlled on Biktarvy once daily. Will check labs today for him and continue. Advised of the HMAP re-enrollment period coming up in January - will need to make an appointment with Juliann Pulse. Discussed U=U concept in addition to safe sex counseling and prevention of other STIs. He can return for follow up again with [name] in 6 months barring his lab work continues to look good. Urine GC/C negative recently. RPR to be assessed today for STI screening. Will need dental referral at next appointment.

## 2018-06-02 NOTE — Assessment & Plan Note (Signed)
Refill triamcinolone lotion.

## 2018-06-02 NOTE — Assessment & Plan Note (Addendum)
He has had no improvement with regards to his pain after TKR. I am not certain he has had follow up with Dr. Mardelle Matte - His knee on exam is swollen and painful to the touch with gait affected/limited. Very slow moving. Incision is healed with some erosion at distal site in the context of other scars and what looks like scratching. No significant warmth, induration or areas of fluctuance. Will check ESR/CRP today and CBC. He has no night sweats, fevers or chills so less likely believed to be infectious. I will give him an injection of ketorolac 60 mg x 1 today to help with pain. He has a history of previous long term opioid use but was unfortunately discharged from pain clinic due to dirty urine. He has since been to 7 day rehab program and insists he has remained clean since then.   We were able to get him an appointment with Dr. Mardelle Matte Friday of this week for follow up to determine next steps. He needs right knee replaced as well for end-stage arthritis.

## 2018-06-02 NOTE — Patient Instructions (Addendum)
https://www.strong.com/  Please look into the above web site to help with understanding the "Undetectable = Untransmittable" concept. As long as your viral load is low/undetectable and you continue taking your medications every day you are at no risk to transfer HIV to a partner through sex.   We got you an appointment Friday of this week with Dr. Luanna Cole office at 9:00 am - you will be a walk in so you will have to wait a little bit but this is the earliest they can see you.    We can see you back here in 6 months with [name] with labs 2 weeks before if you can.

## 2018-06-03 MED ORDER — KETOROLAC TROMETHAMINE 30 MG/ML IJ SOLN: 30.0000 mg | Freq: Once | INTRAMUSCULAR | Status: DC

## 2018-06-03 MED ORDER — KETOROLAC TROMETHAMINE 30 MG/ML IJ SOLN
30.0000 mg | Freq: Once | INTRAMUSCULAR | Status: AC
  Administered 2018-06-02: 30 mg via INTRAMUSCULAR

## 2018-06-03 MED ORDER — KETOROLAC TROMETHAMINE 30 MG/ML IJ SOLN: 30.0000 mg | Freq: Once | INTRAMUSCULAR | Status: AC

## 2018-06-03 MED FILL — TRIAMCINOLONE 0.1% LOTION: 0.1 | 20 days supply | Qty: 60 | Fill #0

## 2018-06-03 MED FILL — BIKTARVY 50-200-25 MG TABS: 50-200-25 | 30 days supply | Qty: 30 | Fill #0

## 2018-06-04 LAB — HIV-1 RNA QUANT-NO REFLEX-BLD
HIV 1 RNA Quant: 22 copies/mL — ABNORMAL HIGH
HIV-1 RNA QUANT, LOG: 1.34 {Log_copies}/mL — AB

## 2018-06-04 LAB — HEMOGLOBIN A1C
Hgb A1c MFr Bld: 5.4 % of total Hgb (ref <5.7)
Mean Plasma Glucose: 108 (calc)
eAG (mmol/L): 6 (calc)

## 2018-06-04 LAB — T-HELPER CELL (CD4) - (RCID CLINIC ONLY)
CD4 % Helper T Cell: 20 % — ABNORMAL LOW (ref 33–55)
CD4 T CELL ABS: 1140 /uL (ref 400–2700)

## 2018-06-04 LAB — COMPLETE METABOLIC PANEL WITH GFR
AG Ratio: 1.3 (calc) (ref 1.0–2.5)
ALBUMIN MSPROF: 4.5 g/dL (ref 3.6–5.1)
ALKALINE PHOSPHATASE (APISO): 91 U/L (ref 40–115)
ALT: 11 U/L (ref 9–46)
AST: 27 U/L (ref 10–40)
BUN / CREAT RATIO: 23 (calc) — AB (ref 6–22)
BUN: 26 mg/dL — ABNORMAL HIGH (ref 7–25)
CALCIUM: 9.5 mg/dL (ref 8.6–10.3)
CO2: 26 mmol/L (ref 20–32)
CREATININE: 1.11 mg/dL (ref 0.60–1.35)
Chloride: 98 mmol/L (ref 98–110)
GFR, EST AFRICAN AMERICAN: 93 mL/min/{1.73_m2} (ref >=60)
GFR, EST NON AFRICAN AMERICAN: 80 mL/min/{1.73_m2} (ref >=60)
GLOBULIN: 3.6 g/dL (ref 1.9–3.7)
Glucose, Bld: 82 mg/dL (ref 65–99)
Potassium: 3.8 mmol/L (ref 3.5–5.3)
SODIUM: 136 mmol/L (ref 135–146)
Total Bilirubin: 0.4 mg/dL (ref 0.2–1.2)
Total Protein: 8.1 g/dL (ref 6.1–8.1)

## 2018-06-04 LAB — RPR: RPR Ser Ql: NONREACTIVE

## 2018-06-04 LAB — C-REACTIVE PROTEIN: CRP: 27.8 mg/L — ABNORMAL HIGH (ref <8.0)

## 2018-06-04 LAB — SEDIMENTATION RATE: Sed Rate: 33 mm/h — ABNORMAL HIGH (ref 0–15)

## 2018-06-25 ENCOUNTER — Other Ambulatory Visit (INDEPENDENT_AMBULATORY_CARE_PROVIDER_SITE_OTHER): Payer: Self-pay | Admitting: Physician Assistant

## 2018-06-25 DIAGNOSIS — R112 Nausea with vomiting, unspecified: Secondary | ICD-10-CM

## 2018-06-25 NOTE — Telephone Encounter (Signed)
FWD to PCP. Tempestt S Roberts, CMA  

## 2018-07-02 ENCOUNTER — Encounter (HOSPITAL_COMMUNITY): Payer: Self-pay | Admitting: Emergency Medicine

## 2018-07-02 ENCOUNTER — Emergency Department (HOSPITAL_COMMUNITY)
Admission: EM | Admit: 2018-07-02 | Discharge: 2018-07-02 | Disposition: A | Discharge status: Home / Self Care | Payer: Medicaid Other | Attending: Emergency Medicine | Admitting: Emergency Medicine

## 2018-07-02 DIAGNOSIS — I10 Essential (primary) hypertension: Secondary | ICD-10-CM | POA: Diagnosis not present

## 2018-07-02 DIAGNOSIS — M549 Dorsalgia, unspecified: Secondary | ICD-10-CM | POA: Diagnosis present

## 2018-07-02 DIAGNOSIS — F17210 Nicotine dependence, cigarettes, uncomplicated: Secondary | ICD-10-CM | POA: Diagnosis not present

## 2018-07-02 DIAGNOSIS — Z79899 Other long term (current) drug therapy: Secondary | ICD-10-CM | POA: Insufficient documentation

## 2018-07-02 DIAGNOSIS — M546 Pain in thoracic spine: Secondary | ICD-10-CM | POA: Insufficient documentation

## 2018-07-02 MED ORDER — OXYCODONE-ACETAMINOPHEN 5-325 MG PO TABS
1.0000 | ORAL_TABLET | Freq: Once | ORAL | Status: AC
  Administered 2018-07-02: 1 via ORAL
  Filled 2018-07-02: qty 1

## 2018-07-02 MED ORDER — PREDNISONE 20 MG PO TABS
60.0000 mg | ORAL_TABLET | Freq: Once | ORAL | Status: AC
  Administered 2018-07-02: 60 mg via ORAL
  Filled 2018-07-02: qty 3

## 2018-07-02 MED ORDER — PREDNISONE 10 MG PO TABS: 20.0000 mg | ORAL_TABLET | Freq: Every day | ORAL | 0 refills | Status: AC

## 2018-07-02 MED FILL — LISINOPRIL 40 MG TABLET: 40 | 30 days supply | Qty: 30 | Fill #8

## 2018-07-02 MED FILL — GABAPENTIN 300 MG CAPSULE: 300 | 30 days supply | Qty: 180 | Fill #4

## 2018-07-02 MED FILL — TRIAMCINOLONE 0.1% LOTION: 0.1 | 20 days supply | Qty: 60 | Fill #1

## 2018-07-02 MED FILL — PROMETHAZINE 25 MG TABLET: 25 | 10 days supply | Qty: 30 | Fill #3

## 2018-07-02 MED FILL — BIKTARVY 50-200-25 MG TABS: 50-200-25 | 30 days supply | Qty: 30 | Fill #1

## 2018-07-02 MED FILL — CHLORTHALIDONE 50 MG TABS: 50 | 30 days supply | Qty: 30 | Fill #4

## 2018-07-02 MED FILL — CITALOPRAM HBR 40 MG TABLET: 40 | 30 days supply | Qty: 30 | Fill #4

## 2018-07-02 MED FILL — ATORVASTATIN 40 MG TABLET: 40 | 30 days supply | Qty: 30 | Fill #4

## 2018-07-02 MED FILL — QUETIAPINE FUMARATE 50 MG T: 50 | 30 days supply | Qty: 30 | Fill #3

## 2018-07-02 NOTE — ED Notes (Signed)
Pt reports back pain with radiation into legs X few days. States hx of spinal fusion and chronic back pain. Ambulatory.

## 2018-07-02 NOTE — Discharge Instructions (Signed)
Apply ice to your areas of pain for 20 minutes at a time. Take the steroids as prescribed, starting tomorrow. You can take Tylenol every 4-6 hours as needed for pain. Follow-up closely with your primary care provider.  They can provide referral for a neurosurgeon to follow-up on your worsening back pain. Turn to the emergency department for new or worsening symptoms.
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
## 2018-07-07 ENCOUNTER — Ambulatory Visit (INDEPENDENT_AMBULATORY_CARE_PROVIDER_SITE_OTHER): Payer: Medicaid Other | Admitting: Family Medicine

## 2018-07-24 ENCOUNTER — Telehealth: Payer: Self-pay

## 2018-07-24 ENCOUNTER — Encounter: Payer: Self-pay | Admitting: Nurse Practitioner

## 2018-07-24 ENCOUNTER — Ambulatory Visit: Payer: Medicaid Other | Attending: Nurse Practitioner | Admitting: Nurse Practitioner

## 2018-07-24 VITALS — BP 148/98 | HR 112 | Temp 99.1°F | Ht 69.0 in | Wt 201.6 lb

## 2018-07-24 DIAGNOSIS — M25562 Pain in left knee: Secondary | ICD-10-CM | POA: Diagnosis not present

## 2018-07-24 DIAGNOSIS — F4310 Post-traumatic stress disorder, unspecified: Secondary | ICD-10-CM | POA: Insufficient documentation

## 2018-07-24 DIAGNOSIS — F200 Paranoid schizophrenia: Secondary | ICD-10-CM | POA: Diagnosis not present

## 2018-07-24 DIAGNOSIS — B1920 Unspecified viral hepatitis C without hepatic coma: Secondary | ICD-10-CM | POA: Insufficient documentation

## 2018-07-24 DIAGNOSIS — Z880 Allergy status to penicillin: Secondary | ICD-10-CM | POA: Insufficient documentation

## 2018-07-24 DIAGNOSIS — I10 Essential (primary) hypertension: Secondary | ICD-10-CM | POA: Diagnosis not present

## 2018-07-24 DIAGNOSIS — F319 Bipolar disorder, unspecified: Secondary | ICD-10-CM | POA: Diagnosis not present

## 2018-07-24 DIAGNOSIS — Z79899 Other long term (current) drug therapy: Secondary | ICD-10-CM | POA: Insufficient documentation

## 2018-07-24 DIAGNOSIS — R000 Tachycardia, unspecified: Secondary | ICD-10-CM

## 2018-07-24 DIAGNOSIS — Z8673 Personal history of transient ischemic attack (TIA), and cerebral infarction without residual deficits: Secondary | ICD-10-CM | POA: Diagnosis not present

## 2018-07-24 DIAGNOSIS — G8929 Other chronic pain: Secondary | ICD-10-CM | POA: Insufficient documentation

## 2018-07-24 DIAGNOSIS — Z96652 Presence of left artificial knee joint: Secondary | ICD-10-CM | POA: Diagnosis not present

## 2018-07-24 DIAGNOSIS — M25561 Pain in right knee: Secondary | ICD-10-CM | POA: Diagnosis not present

## 2018-07-24 MED ORDER — METOPROLOL SUCCINATE ER 25 MG PO TB24: 12.5000 mg | ORAL_TABLET | Freq: Every day | ORAL | 1 refills | Status: DC

## 2018-07-24 MED ORDER — ACETAMINOPHEN-CODEINE #3 300-30 MG PO TABS: 2.0000 | ORAL_TABLET | Freq: Three times a day (TID) | ORAL | 0 refills | Status: DC | PRN
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
## 2018-07-24 NOTE — Telephone Encounter (Signed)
CMA attempt to reach patient to inform he have a pain contract he need to sign.  No answer and left a VM.

## 2018-07-25 ENCOUNTER — Encounter: Payer: Self-pay | Admitting: Nurse Practitioner

## 2018-07-27 MED FILL — CHLORTHALIDONE 50 MG TABS: 50 | 30 days supply | Qty: 30 | Fill #5

## 2018-07-27 MED FILL — LISINOPRIL 40 MG TABLET: 40 | 30 days supply | Qty: 30 | Fill #9

## 2018-07-27 MED FILL — ATORVASTATIN 40 MG TABLET: 40 | 30 days supply | Qty: 30 | Fill #5

## 2018-07-27 MED FILL — BIKTARVY 50-200-25 MG TABS: 50-200-25 | 30 days supply | Qty: 30 | Fill #2

## 2018-07-27 MED FILL — GABAPENTIN 300 MG CAPSULE: 300 | 30 days supply | Qty: 180 | Fill #5

## 2018-07-27 MED FILL — CITALOPRAM HBR 40 MG TABLET: 40 | 30 days supply | Qty: 30 | Fill #5

## 2018-07-27 MED FILL — QUETIAPINE FUMARATE 50 MG T: 50 | 30 days supply | Qty: 30 | Fill #4

## 2018-07-27 MED FILL — TRIAMCINOLONE 0.1% LOTION: 0.1 | 20 days supply | Qty: 60 | Fill #2

## 2018-07-27 MED FILL — METOPROLOL SUCCINATE ER 25: 25 | 30 days supply | Qty: 15 | Fill #0

## 2018-07-28 ENCOUNTER — Other Ambulatory Visit: Payer: Self-pay

## 2018-07-28 DIAGNOSIS — R112 Nausea with vomiting, unspecified: Secondary | ICD-10-CM

## 2018-07-28 MED ORDER — PROMETHAZINE HCL 25 MG PO TABS: 25.0000 mg | ORAL_TABLET | Freq: Three times a day (TID) | ORAL | 0 refills | Status: DC | PRN

## 2018-07-29 MED FILL — PROMETHAZINE 25 MG TABLET: 25 | 10 days supply | Qty: 30 | Fill #0

## 2018-08-10 ENCOUNTER — Other Ambulatory Visit: Payer: Self-pay | Admitting: Nurse Practitioner

## 2018-08-10 ENCOUNTER — Telehealth: Payer: Self-pay | Admitting: Nurse Practitioner

## 2018-08-10 DIAGNOSIS — G894 Chronic pain syndrome: Secondary | ICD-10-CM

## 2018-08-10 NOTE — Telephone Encounter (Signed)
Will route to PCP 

## 2018-08-10 NOTE — Telephone Encounter (Signed)
Patient called stating his PCP was going to send him to pain management.  [facility] [address] Fax: [phone] Phone: [phone]

## 2018-08-10 NOTE — Telephone Encounter (Signed)
Referral has been placed. 

## 2018-08-14 ENCOUNTER — Encounter: Payer: Self-pay | Admitting: Pharmacist

## 2018-08-21 ENCOUNTER — Other Ambulatory Visit: Payer: Self-pay | Admitting: Pharmacist

## 2018-08-21 DIAGNOSIS — F419 Anxiety disorder, unspecified: Secondary | ICD-10-CM

## 2018-08-21 MED ORDER — ATORVASTATIN CALCIUM 40 MG PO TABS: 40.0000 mg | ORAL_TABLET | Freq: Every day | ORAL | 0 refills | Status: AC

## 2018-08-21 MED ORDER — CITALOPRAM HYDROBROMIDE 40 MG PO TABS: 40.0000 mg | ORAL_TABLET | Freq: Every day | ORAL | 2 refills | Status: DC

## 2018-08-24 ENCOUNTER — Other Ambulatory Visit: Payer: Self-pay

## 2018-08-24 DIAGNOSIS — Z760 Encounter for issue of repeat prescription: Secondary | ICD-10-CM

## 2018-08-24 MED ORDER — GABAPENTIN 300 MG PO CAPS: 600.0000 mg | ORAL_CAPSULE | Freq: Three times a day (TID) | ORAL | 2 refills | Status: DC

## 2018-08-25 MED FILL — CHLORTHALIDONE 50 MG TABS: 50 | 30 days supply | Qty: 30 | Fill #6

## 2018-08-25 MED FILL — GABAPENTIN 300 MG CAPSULE: 300 | 30 days supply | Qty: 180 | Fill #0

## 2018-08-25 MED FILL — BIKTARVY 50-200-25 MG TABS: 50-200-25 | 30 days supply | Qty: 30 | Fill #3

## 2018-08-25 MED FILL — PROMETHAZINE 25 MG TABLET: 25 | 10 days supply | Qty: 30 | Fill #0

## 2018-08-25 MED FILL — METOPROLOL SUCCINATE ER 25: 25 | 30 days supply | Qty: 15 | Fill #1

## 2018-08-25 MED FILL — TRIAMCINOLONE 0.1% LOTION: 0.1 | 20 days supply | Qty: 60 | Fill #3

## 2018-08-25 MED FILL — ATORVASTATIN 40 MG TABLET: 40 | 30 days supply | Qty: 30 | Fill #0

## 2018-08-25 MED FILL — CITALOPRAM HBR 40 MG TABLET: 40 | 30 days supply | Qty: 30 | Fill #0

## 2018-08-25 MED FILL — QUETIAPINE FUMARATE 50 MG T: 50 | 30 days supply | Qty: 30 | Fill #5

## 2018-08-25 MED FILL — LISINOPRIL 40 MG TABLET: 40 | 30 days supply | Qty: 30 | Fill #10

## 2018-08-27 ENCOUNTER — Ambulatory Visit: Payer: Self-pay

## 2018-09-19 MED FILL — PROMETHAZINE 25 MG TABLET: 25 | 10 days supply | Qty: 30 | Fill #1

## 2018-09-19 MED FILL — CITALOPRAM HBR 40 MG TABLET: 40 | 30 days supply | Qty: 30 | Fill #1

## 2018-09-19 MED FILL — BIKTARVY 50-200-25 MG TABS: 50-200-25 | 30 days supply | Qty: 30 | Fill #4

## 2018-09-19 MED FILL — LISINOPRIL 40 MG TABLET: 40 | 30 days supply | Qty: 30 | Fill #11

## 2018-09-19 MED FILL — GABAPENTIN 300 MG CAPSULE: 300 | 30 days supply | Qty: 180 | Fill #1

## 2018-09-19 MED FILL — ATORVASTATIN 40 MG TABLET: 40 | 30 days supply | Qty: 30 | Fill #1

## 2018-10-15 ENCOUNTER — Other Ambulatory Visit (INDEPENDENT_AMBULATORY_CARE_PROVIDER_SITE_OTHER): Payer: Self-pay | Admitting: Nurse Practitioner

## 2018-10-15 DIAGNOSIS — Z760 Encounter for issue of repeat prescription: Secondary | ICD-10-CM

## 2018-10-16 ENCOUNTER — Other Ambulatory Visit: Payer: Self-pay | Admitting: Pharmacist

## 2018-10-16 ENCOUNTER — Other Ambulatory Visit: Payer: Self-pay | Admitting: Nurse Practitioner

## 2018-10-16 DIAGNOSIS — R112 Nausea with vomiting, unspecified: Secondary | ICD-10-CM

## 2018-10-16 MED ORDER — ONDANSETRON 8 MG PO TBDP: 8.0000 mg | ORAL_TABLET | Freq: Three times a day (TID) | ORAL | 1 refills | Status: DC | PRN

## 2018-10-16 NOTE — Progress Notes (Unsigned)
Promethazine requested to be sent to [facility]. Has appt coming up 11/10/2018. Will send to PCP for review.

## 2018-10-16 NOTE — Progress Notes (Signed)
Denied request for phenergan. Zofran has been sent.

## 2018-10-17 MED FILL — ONDANSETRON HCL 8 MG TABLET: 8 | 10 days supply | Qty: 30 | Fill #0

## 2018-10-19 MED FILL — ATORVASTATIN 40 MG TABLET: 40 | 30 days supply | Qty: 30 | Fill #2

## 2018-10-19 MED FILL — CITALOPRAM HBR 40 MG TABLET: 40 | 30 days supply | Qty: 30 | Fill #2

## 2018-10-19 MED FILL — BIKTARVY 50-200-25 MG TABS: 50-200-25 | 30 days supply | Qty: 30 | Fill #5

## 2018-10-19 MED FILL — GABAPENTIN 300 MG CAPSULE: 300 | 30 days supply | Qty: 180 | Fill #2

## 2018-10-19 MED FILL — LISINOPRIL 40 MG TABLET: 40 | 30 days supply | Qty: 30 | Fill #0
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
## 2018-10-31 IMAGING — CR DG KNEE 1-2V*R*
2 series · 2 of 2 positions shown · non-contrast
Comparison: None.

CLINICAL DATA: Chronic bilateral knee pain.  No known injury.

EXAM:
RIGHT KNEE - 1-2 VIEW

[knee ap]
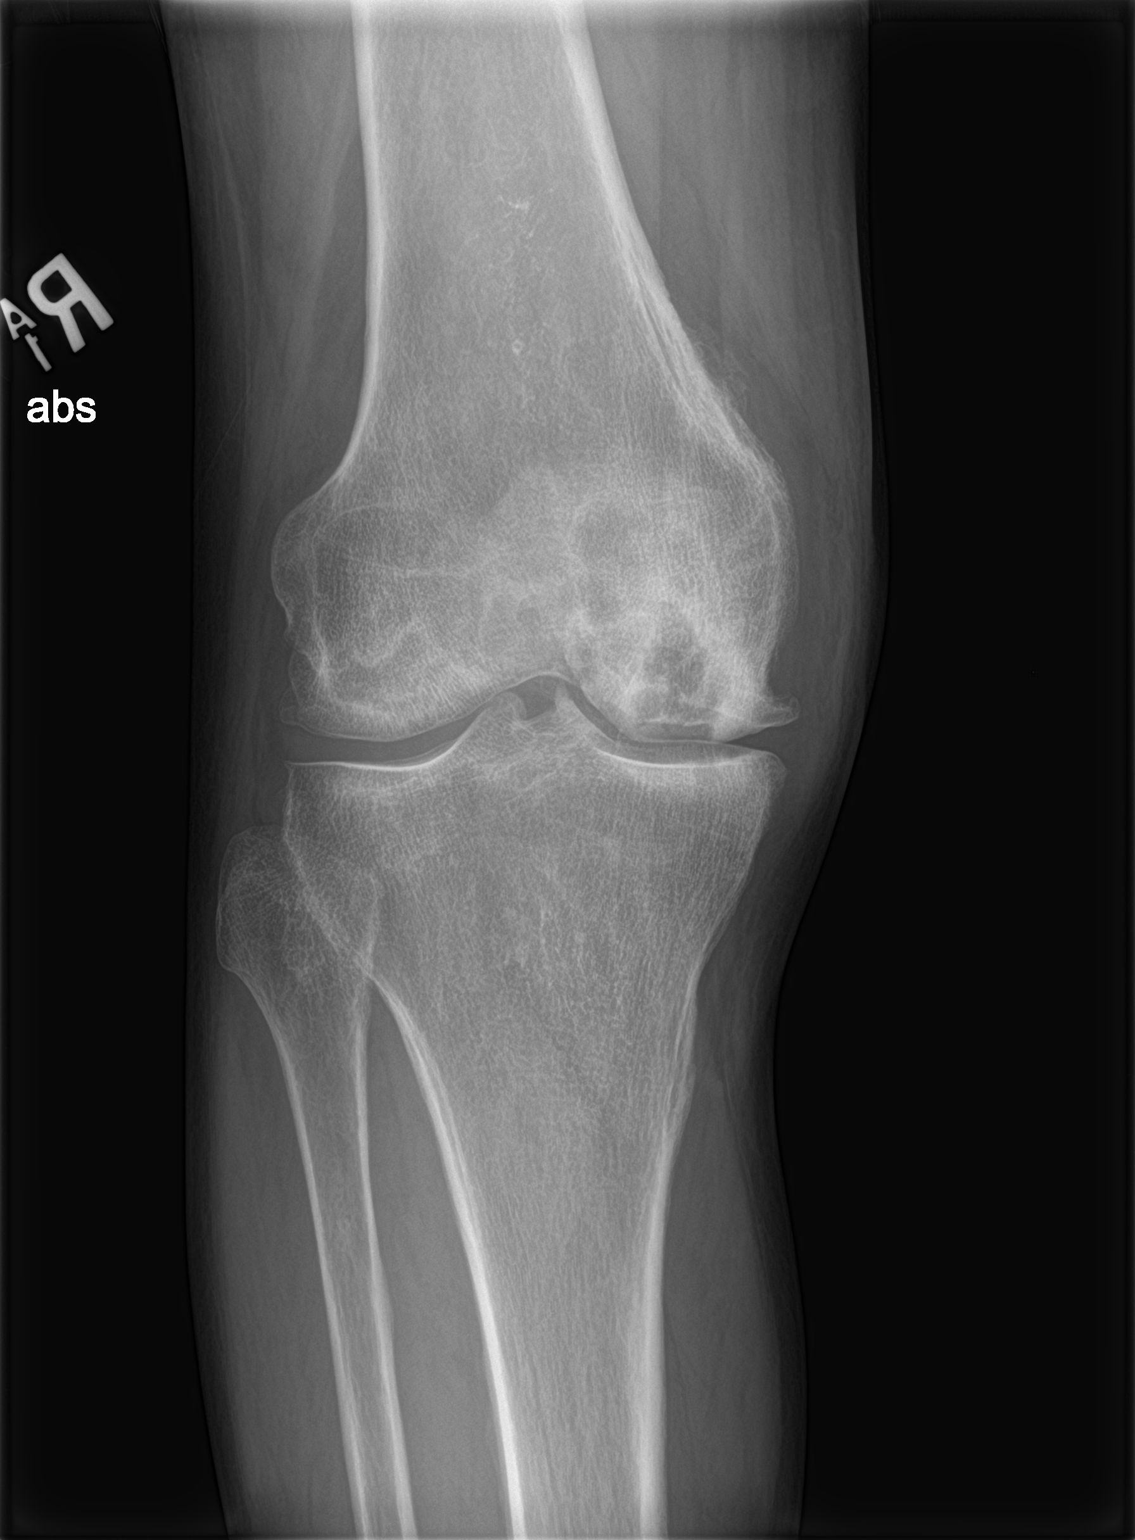

[knee lat]
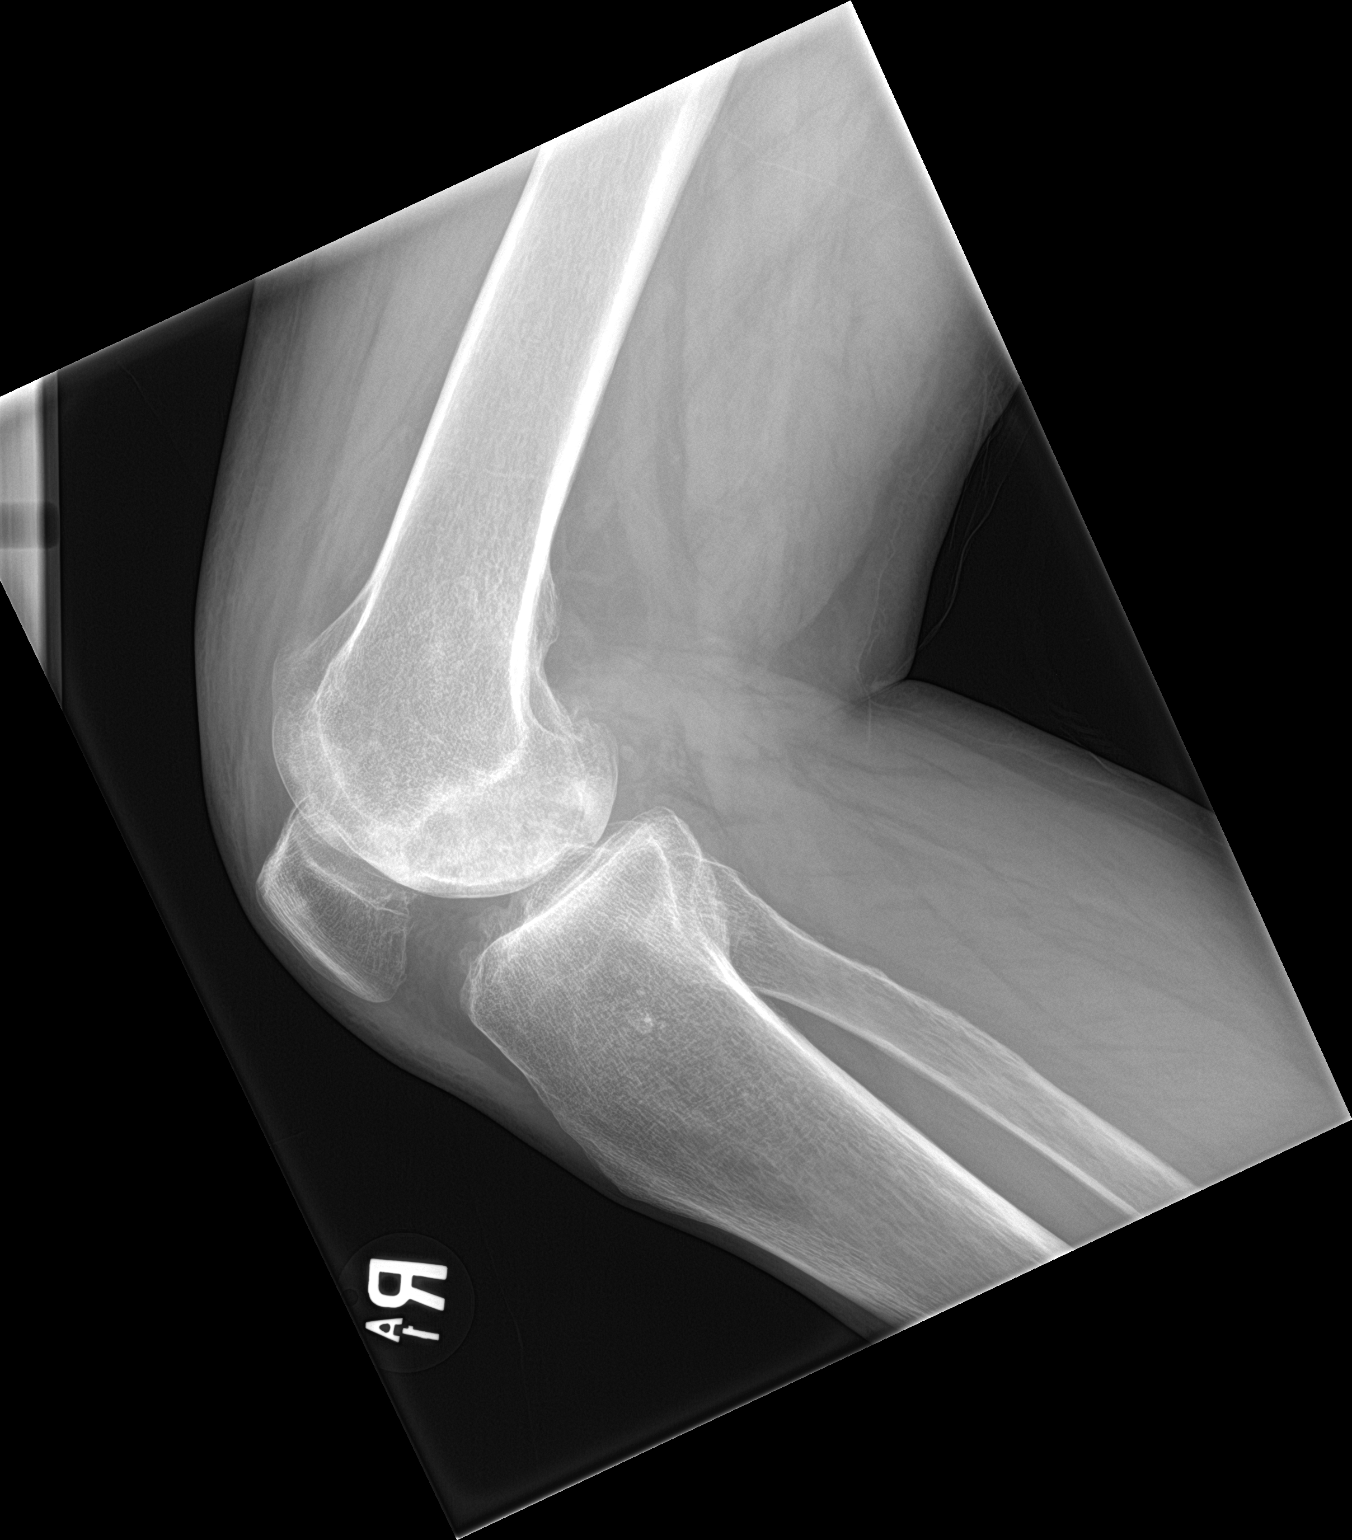

[2 of 2 positions shown; findings below may reference images not displayed]

FINDINGS: Large osteochondral defect on the medial femoral condyle measures
approximately 1.5 cm AP x 1.5 cm transverse. There is underlying
cystic change. Osteophytosis about the knee is worst medially and
laterally. No joint effusion. No acute bony abnormality.
IMPRESSION: No acute finding.

Large osteochondral defect medial femoral condyle.

Osteoarthritis.

## 2018-11-10 ENCOUNTER — Ambulatory Visit: Payer: Self-pay | Admitting: Nurse Practitioner

## 2018-11-13 ENCOUNTER — Other Ambulatory Visit: Payer: Self-pay

## 2018-11-13 ENCOUNTER — Other Ambulatory Visit: Payer: Self-pay | Admitting: Infectious Diseases

## 2018-11-13 ENCOUNTER — Encounter: Payer: Self-pay | Admitting: Nurse Practitioner

## 2018-11-13 ENCOUNTER — Ambulatory Visit: Payer: Medicaid Other | Attending: Nurse Practitioner | Admitting: Nurse Practitioner

## 2018-11-13 VITALS — BP 169/94 | HR 83 | Temp 98.6°F | Ht 69.0 in | Wt 196.0 lb

## 2018-11-13 DIAGNOSIS — B20 Human immunodeficiency virus [HIV] disease: Secondary | ICD-10-CM | POA: Diagnosis not present

## 2018-11-13 DIAGNOSIS — M2550 Pain in unspecified joint: Secondary | ICD-10-CM | POA: Insufficient documentation

## 2018-11-13 DIAGNOSIS — Z79899 Other long term (current) drug therapy: Secondary | ICD-10-CM | POA: Insufficient documentation

## 2018-11-13 DIAGNOSIS — I10 Essential (primary) hypertension: Secondary | ICD-10-CM | POA: Diagnosis not present

## 2018-11-13 DIAGNOSIS — F17200 Nicotine dependence, unspecified, uncomplicated: Secondary | ICD-10-CM

## 2018-11-13 DIAGNOSIS — F209 Schizophrenia, unspecified: Secondary | ICD-10-CM | POA: Diagnosis not present

## 2018-11-13 DIAGNOSIS — F419 Anxiety disorder, unspecified: Secondary | ICD-10-CM

## 2018-11-13 DIAGNOSIS — M545 Low back pain: Secondary | ICD-10-CM | POA: Insufficient documentation

## 2018-11-13 DIAGNOSIS — Z7901 Long term (current) use of anticoagulants: Secondary | ICD-10-CM | POA: Diagnosis not present

## 2018-11-13 DIAGNOSIS — F4310 Post-traumatic stress disorder, unspecified: Secondary | ICD-10-CM | POA: Insufficient documentation

## 2018-11-13 DIAGNOSIS — F319 Bipolar disorder, unspecified: Secondary | ICD-10-CM | POA: Insufficient documentation

## 2018-11-13 DIAGNOSIS — Z8673 Personal history of transient ischemic attack (TIA), and cerebral infarction without residual deficits: Secondary | ICD-10-CM | POA: Diagnosis not present

## 2018-11-13 DIAGNOSIS — L209 Atopic dermatitis, unspecified: Secondary | ICD-10-CM | POA: Insufficient documentation

## 2018-11-13 DIAGNOSIS — R112 Nausea with vomiting, unspecified: Secondary | ICD-10-CM

## 2018-11-13 DIAGNOSIS — M1711 Unilateral primary osteoarthritis, right knee: Secondary | ICD-10-CM | POA: Diagnosis not present

## 2018-11-13 MED ORDER — DULOXETINE HCL 60 MG PO CPEP: 60.0000 mg | ORAL_CAPSULE | Freq: Every day | ORAL | 1 refills | Status: DC

## 2018-11-13 MED ORDER — LISINOPRIL 40 MG PO TABS: 40.0000 mg | ORAL_TABLET | Freq: Every day | ORAL | 0 refills | Status: AC

## 2018-11-13 MED ORDER — TRIAMCINOLONE ACETONIDE 0.1 % EX LOTN: 1.0000 "application " | TOPICAL_LOTION | Freq: Two times a day (BID) | CUTANEOUS | 5 refills | Status: DC

## 2018-11-13 MED ORDER — GABAPENTIN 300 MG PO CAPS: 600.0000 mg | ORAL_CAPSULE | Freq: Three times a day (TID) | ORAL | 2 refills | Status: DC

## 2018-11-13 MED ORDER — METOPROLOL SUCCINATE ER 25 MG PO TB24: 12.5000 mg | ORAL_TABLET | Freq: Every day | ORAL | 1 refills | Status: DC

## 2018-11-13 MED ORDER — CHLORTHALIDONE 50 MG PO TABS: 50.0000 mg | ORAL_TABLET | Freq: Every day | ORAL | 6 refills | Status: AC

## 2018-11-13 MED ORDER — PROMETHAZINE HCL 25 MG PO TABS: 25.0000 mg | ORAL_TABLET | Freq: Every day | ORAL | 1 refills | Status: DC | PRN

## 2018-11-13 MED ORDER — QUETIAPINE FUMARATE 50 MG PO TABS: 50.0000 mg | ORAL_TABLET | Freq: Every day | ORAL | 5 refills | Status: AC

## 2018-11-13 MED ORDER — CITALOPRAM HYDROBROMIDE 40 MG PO TABS: 40.0000 mg | ORAL_TABLET | Freq: Every day | ORAL | 2 refills | Status: DC

## 2018-11-13 MED FILL — PROMETHAZINE 25 MG TABLET: 25 | 30 days supply | Qty: 30 | Fill #0

## 2018-11-13 MED FILL — DULOXETINE HCL 60 MG CPEP: 60 | 30 days supply | Qty: 30 | Fill #0

## 2018-11-13 MED FILL — LISINOPRIL 40 MG TABLET: 40 | 30 days supply | Qty: 30 | Fill #0

## 2018-11-13 MED FILL — CHLORTHALIDONE 50 MG TABLET: 50 | 30 days supply | Qty: 30 | Fill #0

## 2018-11-13 MED FILL — QUETIAPINE FUMARATE 50 MG T: 50 | 30 days supply | Qty: 30 | Fill #0

## 2018-11-13 MED FILL — METOPROLOL SUCCINATE ER 25: 25 | 30 days supply | Qty: 15 | Fill #0

## 2018-11-13 MED FILL — CITALOPRAM HBR 40 MG TABLET: 40 | 30 days supply | Qty: 30 | Fill #0

## 2018-11-13 MED FILL — GABAPENTIN 300 MG CAPSULE: 300 | 30 days supply | Qty: 180 | Fill #0
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
## 2018-11-16 MED FILL — BIKTARVY 50-200-25 MG TABS: 50-200-25 | 30 days supply | Qty: 30 | Fill #0

## 2018-11-18 ENCOUNTER — Telehealth: Payer: Self-pay | Admitting: Nurse Practitioner

## 2018-11-18 ENCOUNTER — Other Ambulatory Visit: Payer: Self-pay | Admitting: Nurse Practitioner

## 2018-11-18 MED ORDER — CELECOXIB 50 MG PO CAPS: 50.0000 mg | ORAL_CAPSULE | Freq: Two times a day (BID) | ORAL | 0 refills | Status: AC

## 2018-11-18 NOTE — Telephone Encounter (Signed)
Patients call returned.  Patient identified by name and date of birth.  Patient states that he has contacted pain management and orthopedics who are not providing him reasonable solutions to his knee pain.  Pain management is offering injections which patient believes will not work for an extended amount of time.  Orthopedics will not perform surgery due to COVID-19.  Patient endorses 10/10 pain and states PCP stated "tylenol 3".  Patient asked for an appointment.  Patient was made an appointment.

## 2018-11-18 NOTE — Telephone Encounter (Signed)
New Message   Pt states he would like to discuss his knee pain and mental status. Please f/u

## 2018-11-19 MED FILL — CELECOXIB 50 MG CAPS: 50 | 30 days supply | Qty: 60 | Fill #0

## 2018-11-19 NOTE — Telephone Encounter (Signed)
Patient will need lab appointment prior

## 2018-11-20 NOTE — Telephone Encounter (Signed)
Pt. Is aware to come in on 11/24/2018 to get labs done.

## 2018-11-21 ENCOUNTER — Encounter: Payer: Self-pay | Admitting: Nurse Practitioner

## 2018-11-24 ENCOUNTER — Ambulatory Visit: Payer: Medicaid Other | Admitting: Nurse Practitioner

## 2018-11-26 DIAGNOSIS — M87051 Idiopathic aseptic necrosis of right femur: Secondary | ICD-10-CM | POA: Insufficient documentation

## 2018-11-26 DIAGNOSIS — M1711 Unilateral primary osteoarthritis, right knee: Secondary | ICD-10-CM | POA: Insufficient documentation

## 2018-11-27 ENCOUNTER — Other Ambulatory Visit: Payer: Self-pay

## 2018-11-27 ENCOUNTER — Ambulatory Visit (INDEPENDENT_AMBULATORY_CARE_PROVIDER_SITE_OTHER): Payer: Medicaid Other

## 2018-11-27 ENCOUNTER — Ambulatory Visit (INDEPENDENT_AMBULATORY_CARE_PROVIDER_SITE_OTHER): Payer: Medicaid Other | Admitting: Orthopaedic Surgery

## 2018-11-27 DIAGNOSIS — M175 Other unilateral secondary osteoarthritis of knee: Secondary | ICD-10-CM

## 2018-11-27 DIAGNOSIS — M87051 Idiopathic aseptic necrosis of right femur: Secondary | ICD-10-CM

## 2018-11-27 MED ORDER — OXYCODONE-ACETAMINOPHEN 10-325 MG PO TABS: 1.0000 | ORAL_TABLET | Freq: Two times a day (BID) | ORAL | 0 refills | Status: DC | PRN

## 2018-11-27 MED FILL — TRIAMCINOLONE ACETONIDE 0.1: 0.1 | 20 days supply | Qty: 60 | Fill #4

## 2018-11-27 MED FILL — OXYCODONE-APAP 10-325 TAB: 10-325 | 7 days supply | Qty: 14 | Fill #0
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
## 2018-12-02 NOTE — Progress Notes (Signed)
[place], [place] [address] Phone: [phone] Fax: [phone]      Your procedure is scheduled on June 22nd.  Report to [facility] Main Entrance "A" at 11:00 A.M., and check in at the Admitting office.  Call this number if you have problems the morning of surgery:  [phone]  Call [phone] if you have any questions prior to your surgery date Monday-Friday 8am-4pm    Remember:  Do not eat after midnight.  You may drink clear liquids until 10:00 AM .   Clear liquids allowed are: Water, Non-Citrus Juices (without pulp), Carbonated Beverages, Clear Tea, Black Coffee Only, and Gatorade  Please complete your PRE-SURGERY ENSURE that was provided to you 3 hours prior to you surgery start time (finish by 10:00 AM).  Please, if able, drink it in one setting. DO NOT SIP.     Take these medicines the morning of surgery with A SIP OF WATER   Atorvastatin (Lipitor)  Citalopram (Celexa)  Cymbalta  Biktarvy  Gabapentin (Neurontin)  Oxycodone - Acetaminophen - if needed  Phenergan - if needed  7 days prior to surgery STOP taking any Aspirin (unless otherwise instructed by your surgeon), Aleve, Naproxen, Ibuprofen, Motrin, Advil, Goody's, BC's, all herbal medications, fish oil, and all vitamins.    The Morning of Surgery  Do not wear jewelry.  Do not wear lotions, powders, colognes, or deodorant  Men may shave face and neck.  Do not bring valuables to the hospital.  [facility] is not responsible for any belongings or valuables.  If you are a smoker, DO NOT Smoke 24 hours prior to surgery IF you wear a CPAP at night please bring your mask, tubing, and machine the morning of surgery   Remember that you must have someone to transport you home after your surgery, and remain with you for 24 hours if you are discharged the same day.   Contacts, glasses, hearing aids, dentures or bridgework  may not be worn into surgery.    Leave your suitcase in the car.  After surgery it may be brought to your room.  For patients admitted to the hospital, discharge time will be determined by your treatment team.  Patients discharged the day of surgery will not be allowed to drive home.    Special instructions:   [facility]- Preparing For Surgery  Before surgery, you can play an important role. Because skin is not sterile, your skin needs to be as free of germs as possible. You can reduce the number of germs on your skin by washing with CHG (chlorahexidine gluconate) Soap before surgery.  CHG is an antiseptic cleaner which kills germs and bonds with the skin to continue killing germs even after washing.    Oral Hygiene is also important to reduce your risk of infection.  Remember - BRUSH YOUR TEETH THE MORNING OF SURGERY WITH YOUR REGULAR TOOTHPASTE  Please do not use if you have an allergy to CHG or antibacterial soaps. If your skin becomes reddened/irritated stop using the CHG.  Do not shave (including legs and underarms) for at least 48 hours prior to first CHG shower. It is OK to shave your face.  Please follow these instructions carefully.   1. Shower the NIGHT BEFORE SURGERY and the MORNING OF SURGERY with CHG Soap.   2. If you chose to wash your hair, wash your hair first as usual with your normal shampoo.  3. After you shampoo, rinse your hair and body thoroughly to remove the shampoo.  4. Use CHG as you would any other liquid soap. You can apply CHG directly to the skin and wash gently with a scrungie or a clean washcloth.   5. Apply the CHG Soap to your body ONLY FROM THE NECK DOWN.  Do not use on open wounds or open sores. Avoid contact with your eyes, ears, mouth and genitals (private parts). Wash Face and genitals (private parts)  with your normal soap.   6. Wash thoroughly, paying special attention to the area where your surgery will be performed.  7. Thoroughly rinse  your body with warm water from the neck down.  8. DO NOT shower/wash with your normal soap after using and rinsing off the CHG Soap.  9. Pat yourself dry with a CLEAN TOWEL.  10. Wear CLEAN PAJAMAS to bed the night before surgery, wear comfortable clothes the morning of surgery  11. Place CLEAN SHEETS on your bed the night of your first shower and DO NOT SLEEP WITH PETS.    Day of Surgery:  Do not apply any deodorants/lotions.  Please wear clean clothes to the hospital/surgery center.   Remember to brush your teeth WITH YOUR REGULAR TOOTHPASTE.   Please read over the following fact sheets that you were given.

## 2018-12-03 ENCOUNTER — Other Ambulatory Visit (HOSPITAL_COMMUNITY): Payer: Medicaid Other

## 2018-12-03 ENCOUNTER — Ambulatory Visit (HOSPITAL_COMMUNITY)
Admission: RE | Admit: 2018-12-03 | Discharge: 2018-12-03 | Disposition: A | Discharge status: Home / Self Care | Payer: Medicaid Other | Source: Ambulatory Visit | Attending: Physician Assistant | Admitting: Physician Assistant

## 2018-12-03 ENCOUNTER — Encounter (HOSPITAL_COMMUNITY)
Admission: RE | Admit: 2018-12-03 | Discharge: 2018-12-03 | Disposition: A | Discharge status: Home / Self Care | Payer: Medicaid Other | Source: Ambulatory Visit | Attending: Orthopaedic Surgery | Admitting: Orthopaedic Surgery

## 2018-12-03 ENCOUNTER — Other Ambulatory Visit (HOSPITAL_COMMUNITY)
Admission: RE | Admit: 2018-12-03 | Discharge: 2018-12-03 | Disposition: A | Discharge status: Home / Self Care | Payer: Medicaid Other | Source: Ambulatory Visit | Attending: Orthopaedic Surgery | Admitting: Orthopaedic Surgery

## 2018-12-03 ENCOUNTER — Other Ambulatory Visit: Payer: Self-pay

## 2018-12-03 DIAGNOSIS — M1711 Unilateral primary osteoarthritis, right knee: Secondary | ICD-10-CM

## 2018-12-03 DIAGNOSIS — Z01818 Encounter for other preprocedural examination: Secondary | ICD-10-CM | POA: Diagnosis not present

## 2018-12-03 DIAGNOSIS — Z1159 Encounter for screening for other viral diseases: Secondary | ICD-10-CM | POA: Diagnosis not present

## 2018-12-03 DIAGNOSIS — R9431 Abnormal electrocardiogram [ECG] [EKG]: Secondary | ICD-10-CM | POA: Diagnosis not present

## 2018-12-03 LAB — CBC WITH DIFFERENTIAL/PLATELET
Abs Immature Granulocytes: 0 10*3/uL (ref 0.00–0.07)
Basophils Absolute: 0 10*3/uL (ref 0.0–0.1)
Basophils Relative: 0 %
Eosinophils Absolute: 0.2 10*3/uL (ref 0.0–0.5)
Eosinophils Relative: 2 %
HCT: 43.1 % (ref 39.0–52.0)
Hemoglobin: 14.1 g/dL (ref 13.0–17.0)
Lymphocytes Relative: 48 %
Lymphs Abs: 5.2 10*3/uL — ABNORMAL HIGH (ref 0.7–4.0)
MCH: 26.3 pg (ref 26.0–34.0)
MCHC: 32.7 g/dL (ref 30.0–36.0)
MCV: 80.4 fL (ref 80.0–100.0)
Monocytes Absolute: 0.5 10*3/uL (ref 0.1–1.0)
Monocytes Relative: 5 %
Neutro Abs: 4.9 10*3/uL (ref 1.7–7.7)
Neutrophils Relative %: 45 %
Platelets: 187 10*3/uL (ref 150–400)
RBC: 5.36 MIL/uL (ref 4.22–5.81)
RDW: 15.1 % (ref 11.5–15.5)
WBC: 10.8 10*3/uL — ABNORMAL HIGH (ref 4.0–10.5)
nRBC: 0 % (ref 0.0–0.2)
nRBC: 0 /100 WBC

## 2018-12-03 LAB — COMPREHENSIVE METABOLIC PANEL
ALT: 15 U/L (ref 0–44)
AST: 29 U/L (ref 15–41)
Albumin: 4.2 g/dL (ref 3.5–5.0)
Alkaline Phosphatase: 86 U/L (ref 38–126)
Anion gap: 10 (ref 5–15)
BUN: 7 mg/dL (ref 6–20)
CO2: 26 mmol/L (ref 22–32)
Calcium: 9.5 mg/dL (ref 8.9–10.3)
Chloride: 100 mmol/L (ref 98–111)
Creatinine, Ser: 0.86 mg/dL (ref 0.61–1.24)
GFR calc Af Amer: >60 mL/min (ref >60)
GFR calc non Af Amer: >60 mL/min (ref >60)
Glucose, Bld: 86 mg/dL (ref 70–99)
Potassium: 4.2 mmol/L (ref 3.5–5.1)
Sodium: 136 mmol/L (ref 135–145)
Total Bilirubin: 0.7 mg/dL (ref 0.3–1.2)
Total Protein: 7.8 g/dL (ref 6.5–8.1)

## 2018-12-03 LAB — SARS CORONAVIRUS 2 (TAT 6-24 HRS): SARS Coronavirus 2: NEGATIVE

## 2018-12-03 LAB — TYPE AND SCREEN
ABO/RH(D): O POS
Antibody Screen: NEGATIVE

## 2018-12-03 LAB — SURGICAL PCR SCREEN
MRSA, PCR: NEGATIVE
Staphylococcus aureus: NEGATIVE

## 2018-12-03 LAB — PROTIME-INR
INR: 1.1 (ref 0.8–1.2)
Prothrombin Time: 13.6 seconds (ref 11.4–15.2)

## 2018-12-03 LAB — ABO/RH: ABO/RH(D): O POS

## 2018-12-03 LAB — APTT: aPTT: 28 seconds (ref 24–36)

## 2018-12-03 NOTE — Progress Notes (Signed)
[facility]  Cardiologist - none at present, Hochrein has consulted in the past  Chest x-ray - today EKG - today Stress Test -  ECHO - 2018 Cardiac Cath -   Sleep Study - n/a CPAP -   Fasting Blood Sugar - n/a Checks Blood Sugar _____ times a day  Blood Thinner Instructions:n/a Aspirin Instructions:n/a  Anesthesia review: will send chart for review   Patient denies shortness of breath, fever, cough and chest pain at PAT appointment   Patient verbalized understanding of instructions that were given to them at the PAT appointment. Patient was also instructed that they will need to review over the PAT instructions again at home before surgery.

## 2018-12-04 ENCOUNTER — Encounter (HOSPITAL_COMMUNITY): Payer: Self-pay | Admitting: Vascular Surgery

## 2018-12-04 MED ORDER — TRANEXAMIC ACID 1000 MG/10ML IV SOLN
2000.0000 mg | INTRAVENOUS | Status: DC
  Filled 2018-12-04: qty 20

## 2018-12-04 MED ORDER — TRANEXAMIC ACID-NACL 1000-0.7 MG/100ML-% IV SOLN
1000.0000 mg | INTRAVENOUS | Status: DC
  Filled 2018-12-04: qty 100

## 2018-12-04 MED ORDER — VANCOMYCIN HCL IN DEXTROSE 1-5 GM/200ML-% IV SOLN
1000.0000 mg | INTRAVENOUS | Status: DC
  Filled 2018-12-04: qty 200

## 2018-12-04 NOTE — Anesthesia Preprocedure Evaluation (Deleted)
Anesthesia Evaluation    Airway        Dental   Pulmonary Current Smoker,           Cardiovascular hypertension,      Neuro/Psych    GI/Hepatic   Endo/Other    Renal/GU      Musculoskeletal   Abdominal   Peds  Hematology   Anesthesia Other Findings   Reproductive/Obstetrics                             Anesthesia Physical Anesthesia Plan  ASA:   Anesthesia Plan:    Post-op Pain Management:    Induction:   PONV Risk Score and Plan:   Airway Management Planned:   Additional Equipment:   Intra-op Plan:   Post-operative Plan:   Informed Consent:   Plan Discussed with:   Anesthesia Plan Comments: (See PAT note written 12/04/2018 by Myra Gianotti, PA-C. He is for UDS on arrival.  )        Anesthesia Quick Evaluation
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
## 2018-12-07 ENCOUNTER — Encounter (HOSPITAL_COMMUNITY)
Admission: RE | Disposition: A | Discharge status: Home / Self Care | Payer: Self-pay | Source: Home / Self Care | Attending: Orthopaedic Surgery

## 2018-12-07 ENCOUNTER — Other Ambulatory Visit: Payer: Self-pay | Admitting: Orthopaedic Surgery

## 2018-12-07 ENCOUNTER — Encounter (HOSPITAL_COMMUNITY): Payer: Self-pay

## 2018-12-07 ENCOUNTER — Ambulatory Visit (HOSPITAL_COMMUNITY)
Admission: RE | Admit: 2018-12-07 | Discharge: 2018-12-07 | Disposition: A | Discharge status: Home / Self Care | Payer: Medicaid Other | Attending: Orthopaedic Surgery | Admitting: Orthopaedic Surgery

## 2018-12-07 SURGERY — ARTHROPLASTY, KNEE, TOTAL
Anesthesia: Spinal | Site: Knee | Laterality: Right

## 2018-12-07 MED ORDER — OXYCODONE-ACETAMINOPHEN 5-325 MG PO TABS: 1.0000 | ORAL_TABLET | Freq: Two times a day (BID) | ORAL | 0 refills | Status: DC | PRN

## 2018-12-07 MED ORDER — CHLORHEXIDINE GLUCONATE 4 % EX LIQD: 60.0000 mL | Freq: Once | CUTANEOUS | Status: DC

## 2018-12-07 MED ORDER — POVIDONE-IODINE 10 % EX SWAB: 2.0000 "application " | Freq: Once | CUTANEOUS | Status: DC

## 2018-12-07 MED ORDER — ACETAMINOPHEN 500 MG PO TABS: 1000.0000 mg | ORAL_TABLET | Freq: Once | ORAL | Status: DC

## 2018-12-07 MED ORDER — LACTATED RINGERS IV SOLN: INTRAVENOUS | Status: DC

## 2018-12-07 MED FILL — OXYCODONE-ACETAMINOPHEN 5-3: 5-325 | 7 days supply | Qty: 14 | Fill #0

## 2018-12-07 NOTE — Anesthesia Preprocedure Evaluation (Addendum)
Anesthesia Evaluation    Reviewed: Allergy & Precautions, Patient's Chart, lab work & pertinent test results  Airway        Dental   Pulmonary Current Smoker,           Cardiovascular hypertension, Pt. on medications   On 04/29/17 he was taken to OR for left TKA Mardelle Matte, Vonna Kotyk, MD), but shortly after the initial incision he had new onset ST elevation and procedure was aborted. EKGs also showed prolonged QT.Troponins cycled, toxicology ordered, and cardiology consulted. Troponin negative x3. Toxicology report positive for cocaine, benzodiazepines, opioids (methadone, oxycodone). Coronary CT showed calcium score 77% percentile for age, but only mild non-obstructive CAD. EF normal by echo. Coronary vasospasm thought to be a possible etiology. His toxicology report was positive for cocaine which could have contributed to ST elevation and prolonged QT. He ultimately had his left TKA on 10/14/17, but had a negative UDS 10 days before surgery and on the day of surgery.     Neuro/Psych PSYCHIATRIC DISORDERS Anxiety Bipolar Disorder Schizophrenia  Neuromuscular disease CVA    GI/Hepatic negative GI ROS, Neg liver ROS, (+) Cirrhosis   ascites  substance abuse  alcohol use, cocaine use, marijuana use and IV drug use, Hepatitis -, C  Endo/Other  negative endocrine ROS  Renal/GU negative Renal ROS  negative genitourinary   Musculoskeletal  (+) Arthritis , Osteoarthritis,  Hx/o AVN   Abdominal   Peds  Hematology  (+) HIV,   Anesthesia Other Findings   Reproductive/Obstetrics                            Anesthesia Physical  Anesthesia Plan  ASA: III  Anesthesia Plan: Spinal   Post-op Pain Management:  Regional for Post-op pain   Induction: Intravenous  PONV Risk Score and Plan: 1 and Treatment may vary due to age or medical condition, Propofol infusion and Dexamethasone  Airway Management Planned: Natural  Airway  Additional Equipment:   Intra-op Plan:   Post-operative Plan: Extubation in OR  Informed Consent:   Plan Discussed with:   Anesthesia Plan Comments: (Case cancelled by pt. due to death in family)      Anesthesia Quick Evaluation

## 2018-12-08 ENCOUNTER — Other Ambulatory Visit: Payer: Self-pay | Admitting: Infectious Diseases

## 2018-12-08 DIAGNOSIS — B20 Human immunodeficiency virus [HIV] disease: Secondary | ICD-10-CM

## 2018-12-14 MED FILL — BIKTARVY 50-200-25 MG TABS: 50-200-25 | 30 days supply | Qty: 30 | Fill #0

## 2018-12-16 ENCOUNTER — Other Ambulatory Visit: Payer: Self-pay

## 2018-12-16 ENCOUNTER — Encounter (HOSPITAL_COMMUNITY): Payer: Self-pay | Admitting: *Deleted

## 2018-12-16 NOTE — Progress Notes (Signed)
Patient informed of the [place] that is currently in effect.  Patient verbalized understanding.  Patient denies shortness of breath, fever, cough and chest pain.  PCP - Geryl Rankins, NP Cardiologist - Denies  Chest x-ray - DOS 12/21/18 EKG - DOS 12/21/18 Stress Test - Denies ECHO - 09/25/18 CE, 04/30/17 EPIC Cardiac Cath - Denies  Sleep Study - Denies CPAP - N/A  ERAS: Clears til 7 am DOS, no drink.  Anesthesia review: Yes   STOP now taking any Aspirin (unless otherwise instructed by your surgeon), Aleve, Naproxen, Ibuprofen, Motrin, Advil, Goody's, BC's, all herbal medications, fish oil, and all vitamins.  Coronavirus Screening Have you or Tyra experienced the following symptoms:  Cough yes/no: No Fever (>100.31F)  yes/no: No Runny nose yes/no: No Sore throat yes/no: No Difficulty breathing/shortness of breath  yes/no: No  Have you or Tyra traveled in the last 14 days and where? yes/no: No

## 2018-12-17 MED ORDER — BUPIVACAINE LIPOSOME 1.3 % IJ SUSP
20.0000 mL | INTRAMUSCULAR | Status: DC
  Filled 2018-12-17: qty 20

## 2018-12-17 MED ORDER — TRANEXAMIC ACID 1000 MG/10ML IV SOLN
2000.0000 mg | INTRAVENOUS | Status: DC
  Filled 2018-12-17: qty 20

## 2018-12-17 NOTE — Anesthesia Preprocedure Evaluation (Addendum)
Anesthesia Evaluation  Patient identified by MRN, date of birth, ID band Patient awake    Reviewed: Allergy & Precautions, H&P , NPO status , Patient's Chart, lab work & pertinent test results, reviewed documented beta blocker date and time   Airway Mallampati: III  TM Distance: >3 FB Neck ROM: Limited  Mouth opening: Limited Mouth Opening  Dental no notable dental hx. (+) Teeth Intact, Dental Advisory Given   Pulmonary Current Smoker,    Pulmonary exam normal breath sounds clear to auscultation       Cardiovascular hypertension, Pt. on medications and Pt. on home beta blockers  Rhythm:Regular Rate:Normal     Neuro/Psych Anxiety Depression Bipolar Disorder Schizophrenia CVA    GI/Hepatic negative GI ROS, (+) Hepatitis -, C  Endo/Other  negative endocrine ROS  Renal/GU negative Renal ROS  negative genitourinary   Musculoskeletal  (+) Arthritis , Osteoarthritis,    Abdominal   Peds  Hematology negative hematology ROS (+)   Anesthesia Other Findings   Reproductive/Obstetrics negative OB ROS                           Anesthesia Physical Anesthesia Plan  ASA: III  Anesthesia Plan: Spinal   Post-op Pain Management:  Regional for Post-op pain   Induction: Intravenous  PONV Risk Score and Plan: 1 and Midazolam, Propofol infusion and Ondansetron  Airway Management Planned: Simple Face Mask  Additional Equipment:   Intra-op Plan:   Post-operative Plan:   Informed Consent: I have reviewed the patients History and Physical, chart, labs and discussed the procedure including the risks, benefits and alternatives for the proposed anesthesia with the patient or authorized representative who has indicated his/her understanding and acceptance.     Dental advisory given  Plan Discussed with: CRNA  Anesthesia Plan Comments: (See PAT note written 12/17/2018 by Myra Gianotti, PA-C. UDS day of  surgery. )      Anesthesia Quick Evaluation
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
## 2018-12-18 ENCOUNTER — Other Ambulatory Visit (HOSPITAL_COMMUNITY)
Admission: RE | Admit: 2018-12-18 | Discharge: 2018-12-18 | Disposition: A | Discharge status: Home / Self Care | Payer: Medicaid Other | Source: Ambulatory Visit | Attending: Orthopaedic Surgery | Admitting: Orthopaedic Surgery

## 2018-12-18 DIAGNOSIS — Z01812 Encounter for preprocedural laboratory examination: Secondary | ICD-10-CM | POA: Insufficient documentation

## 2018-12-18 DIAGNOSIS — Z1159 Encounter for screening for other viral diseases: Secondary | ICD-10-CM | POA: Diagnosis not present

## 2018-12-18 LAB — SARS CORONAVIRUS 2 (TAT 6-24 HRS): SARS Coronavirus 2: NEGATIVE

## 2018-12-21 ENCOUNTER — Inpatient Hospital Stay (HOSPITAL_COMMUNITY): Payer: Medicaid Other | Admitting: Physician Assistant

## 2018-12-21 ENCOUNTER — Other Ambulatory Visit: Payer: Self-pay

## 2018-12-21 ENCOUNTER — Observation Stay (HOSPITAL_COMMUNITY): Payer: Medicaid Other

## 2018-12-21 ENCOUNTER — Encounter (HOSPITAL_COMMUNITY)
Admission: RE | Disposition: A | Discharge status: Home / Self Care | Payer: Self-pay | Source: Home / Self Care | Attending: Orthopaedic Surgery

## 2018-12-21 ENCOUNTER — Encounter (HOSPITAL_COMMUNITY): Payer: Self-pay | Admitting: *Deleted

## 2018-12-21 ENCOUNTER — Observation Stay (HOSPITAL_COMMUNITY)
Admission: RE | Admit: 2018-12-21 | Discharge: 2018-12-22 | Disposition: A | Discharge status: Home / Self Care | Payer: Medicaid Other | Attending: Orthopaedic Surgery | Admitting: Orthopaedic Surgery

## 2018-12-21 DIAGNOSIS — Z21 Asymptomatic human immunodeficiency virus [HIV] infection status: Secondary | ICD-10-CM | POA: Diagnosis not present

## 2018-12-21 DIAGNOSIS — M545 Low back pain: Secondary | ICD-10-CM | POA: Insufficient documentation

## 2018-12-21 DIAGNOSIS — F319 Bipolar disorder, unspecified: Secondary | ICD-10-CM | POA: Diagnosis not present

## 2018-12-21 DIAGNOSIS — I10 Essential (primary) hypertension: Secondary | ICD-10-CM | POA: Insufficient documentation

## 2018-12-21 DIAGNOSIS — G8929 Other chronic pain: Secondary | ICD-10-CM | POA: Insufficient documentation

## 2018-12-21 DIAGNOSIS — F17210 Nicotine dependence, cigarettes, uncomplicated: Secondary | ICD-10-CM | POA: Diagnosis not present

## 2018-12-21 DIAGNOSIS — F209 Schizophrenia, unspecified: Secondary | ICD-10-CM | POA: Diagnosis not present

## 2018-12-21 DIAGNOSIS — Z79899 Other long term (current) drug therapy: Secondary | ICD-10-CM | POA: Insufficient documentation

## 2018-12-21 DIAGNOSIS — M1731 Unilateral post-traumatic osteoarthritis, right knee: Secondary | ICD-10-CM

## 2018-12-21 DIAGNOSIS — Z8619 Personal history of other infectious and parasitic diseases: Secondary | ICD-10-CM | POA: Diagnosis not present

## 2018-12-21 DIAGNOSIS — M1711 Unilateral primary osteoarthritis, right knee: Secondary | ICD-10-CM | POA: Diagnosis not present

## 2018-12-21 DIAGNOSIS — E669 Obesity, unspecified: Secondary | ICD-10-CM | POA: Diagnosis not present

## 2018-12-21 DIAGNOSIS — Z8673 Personal history of transient ischemic attack (TIA), and cerebral infarction without residual deficits: Secondary | ICD-10-CM | POA: Insufficient documentation

## 2018-12-21 DIAGNOSIS — Z6830 Body mass index (BMI) 30.0-30.9, adult: Secondary | ICD-10-CM | POA: Diagnosis not present

## 2018-12-21 DIAGNOSIS — Z96651 Presence of right artificial knee joint: Secondary | ICD-10-CM

## 2018-12-21 DIAGNOSIS — Z96652 Presence of left artificial knee joint: Secondary | ICD-10-CM | POA: Diagnosis not present

## 2018-12-21 DIAGNOSIS — Z791 Long term (current) use of non-steroidal anti-inflammatories (NSAID): Secondary | ICD-10-CM | POA: Insufficient documentation

## 2018-12-21 HISTORY — DX: Depression, unspecified: F32.A

## 2018-12-21 HISTORY — PX: TOTAL KNEE ARTHROPLASTY: SHX125

## 2018-12-21 LAB — RAPID URINE DRUG SCREEN, HOSP PERFORMED
Amphetamines: NOT DETECTED
Barbiturates: NOT DETECTED
Benzodiazepines: NOT DETECTED
Cocaine: NOT DETECTED
Opiates: POSITIVE — AB
Tetrahydrocannabinol: NOT DETECTED

## 2018-12-21 LAB — CBC WITH DIFFERENTIAL/PLATELET
Abs Immature Granulocytes: 0 10*3/uL (ref 0.00–0.07)
Basophils Absolute: 0.1 10*3/uL (ref 0.0–0.1)
Basophils Relative: 1 %
Eosinophils Absolute: 0.4 10*3/uL (ref 0.0–0.5)
Eosinophils Relative: 4 %
HCT: 44.9 % (ref 39.0–52.0)
Hemoglobin: 14.7 g/dL (ref 13.0–17.0)
Lymphocytes Relative: 44 %
Lymphs Abs: 4.9 10*3/uL — ABNORMAL HIGH (ref 0.7–4.0)
MCH: 26.3 pg (ref 26.0–34.0)
MCHC: 32.7 g/dL (ref 30.0–36.0)
MCV: 80.2 fL (ref 80.0–100.0)
Monocytes Absolute: 0.6 10*3/uL (ref 0.1–1.0)
Monocytes Relative: 5 %
Neutro Abs: 5.1 10*3/uL (ref 1.7–7.7)
Neutrophils Relative %: 46 %
Platelets: 196 10*3/uL (ref 150–400)
RBC: 5.6 MIL/uL (ref 4.22–5.81)
RDW: 15.2 % (ref 11.5–15.5)
WBC: 11.1 10*3/uL — ABNORMAL HIGH (ref 4.0–10.5)
nRBC: 0 % (ref 0.0–0.2)
nRBC: 0 /100 WBC

## 2018-12-21 LAB — COMPREHENSIVE METABOLIC PANEL
ALT: 22 U/L (ref 0–44)
AST: 34 U/L (ref 15–41)
Albumin: 4.4 g/dL (ref 3.5–5.0)
Alkaline Phosphatase: 88 U/L (ref 38–126)
Anion gap: 13 (ref 5–15)
BUN: 13 mg/dL (ref 6–20)
CO2: 26 mmol/L (ref 22–32)
Calcium: 10 mg/dL (ref 8.9–10.3)
Chloride: 98 mmol/L (ref 98–111)
Creatinine, Ser: 1.15 mg/dL (ref 0.61–1.24)
GFR calc Af Amer: >60 mL/min (ref >60)
GFR calc non Af Amer: >60 mL/min (ref >60)
Glucose, Bld: 100 mg/dL — ABNORMAL HIGH (ref 70–99)
Potassium: 4.4 mmol/L (ref 3.5–5.1)
Sodium: 137 mmol/L (ref 135–145)
Total Bilirubin: 0.7 mg/dL (ref 0.3–1.2)
Total Protein: 8 g/dL (ref 6.5–8.1)

## 2018-12-21 LAB — APTT: aPTT: 27 seconds (ref 24–36)

## 2018-12-21 LAB — TYPE AND SCREEN
ABO/RH(D): O POS
Antibody Screen: NEGATIVE

## 2018-12-21 LAB — PROTIME-INR
INR: 1 (ref 0.8–1.2)
Prothrombin Time: 12.9 seconds (ref 11.4–15.2)

## 2018-12-21 SURGERY — ARTHROPLASTY, KNEE, TOTAL
Anesthesia: Spinal | Site: Knee | Laterality: Right

## 2018-12-21 MED ORDER — SORBITOL 70 % SOLN: 30.0000 mL | Freq: Every day | Status: DC | PRN

## 2018-12-21 MED ORDER — OXYCODONE HCL 5 MG PO TABS: 5.0000 mg | ORAL_TABLET | Freq: Four times a day (QID) | ORAL | 0 refills | Status: DC | PRN

## 2018-12-21 MED ORDER — TRANEXAMIC ACID-NACL 1000-0.7 MG/100ML-% IV SOLN
INTRAVENOUS | Status: AC
  Filled 2018-12-21: qty 100

## 2018-12-21 MED ORDER — MIDAZOLAM HCL 2 MG/2ML IJ SOLN
INTRAMUSCULAR | Status: AC
  Administered 2018-12-21: 2 mg
  Filled 2018-12-21: qty 2

## 2018-12-21 MED ORDER — POTASSIUM 99 MG PO TABS: 99.0000 mg | ORAL_TABLET | Freq: Every day | ORAL | Status: DC

## 2018-12-21 MED ORDER — BUPIVACAINE-EPINEPHRINE 0.25% -1:200000 IJ SOLN
INTRAMUSCULAR | Status: DC | PRN
  Administered 2018-12-21: 20 mL

## 2018-12-21 MED ORDER — PHENOL 1.4 % MT LIQD: 1.0000 | OROMUCOSAL | Status: DC | PRN

## 2018-12-21 MED ORDER — BICTEGRAVIR-EMTRICITAB-TENOFOV 50-200-25 MG PO TABS
1.0000 | ORAL_TABLET | Freq: Every day | ORAL | Status: DC
  Filled 2018-12-21: qty 1

## 2018-12-21 MED ORDER — CELECOXIB 200 MG PO CAPS
200.0000 mg | ORAL_CAPSULE | Freq: Two times a day (BID) | ORAL | Status: DC
  Administered 2018-12-21 – 2018-12-22 (×3): 200 mg via ORAL
  Filled 2018-12-21 (×3): qty 1

## 2018-12-21 MED ORDER — CHLORHEXIDINE GLUCONATE 4 % EX LIQD: 60.0000 mL | Freq: Once | CUTANEOUS | Status: DC

## 2018-12-21 MED ORDER — VITAMIN D 25 MCG (1000 UNIT) PO TABS
2000.0000 [IU] | ORAL_TABLET | Freq: Every day | ORAL | Status: DC
  Administered 2018-12-22: 2000 [IU] via ORAL
  Filled 2018-12-21: qty 2

## 2018-12-21 MED ORDER — MENTHOL 3 MG MT LOZG: 1.0000 | LOZENGE | OROMUCOSAL | Status: DC | PRN

## 2018-12-21 MED ORDER — MIDAZOLAM HCL 2 MG/2ML IJ SOLN: 2.0000 mg | Freq: Once | INTRAMUSCULAR | Status: DC

## 2018-12-21 MED ORDER — OXYCODONE HCL 5 MG PO TABS
5.0000 mg | ORAL_TABLET | ORAL | Status: DC | PRN
  Filled 2018-12-21: qty 2

## 2018-12-21 MED ORDER — METOCLOPRAMIDE HCL 5 MG PO TABS: 5.0000 mg | ORAL_TABLET | Freq: Three times a day (TID) | ORAL | Status: DC | PRN

## 2018-12-21 MED ORDER — ALBUMIN HUMAN 5 % IV SOLN
INTRAVENOUS | Status: AC
  Filled 2018-12-21: qty 250

## 2018-12-21 MED ORDER — ACETAMINOPHEN 500 MG PO TABS
1000.0000 mg | ORAL_TABLET | Freq: Once | ORAL | Status: AC
  Administered 2018-12-21: 1000 mg via ORAL
  Filled 2018-12-21: qty 2

## 2018-12-21 MED ORDER — ALUM & MAG HYDROXIDE-SIMETH 200-200-20 MG/5ML PO SUSP
30.0000 mL | ORAL | Status: DC | PRN
  Filled 2018-12-21: qty 30

## 2018-12-21 MED ORDER — PROMETHAZINE HCL 25 MG PO TABS: 25.0000 mg | ORAL_TABLET | Freq: Four times a day (QID) | ORAL | 1 refills | Status: DC | PRN

## 2018-12-21 MED ORDER — VANCOMYCIN HCL 1000 MG IV SOLR
INTRAVENOUS | Status: DC | PRN
  Administered 2018-12-21: 1000 mg

## 2018-12-21 MED ORDER — LIDOCAINE 2% (20 MG/ML) 5 ML SYRINGE
INTRAMUSCULAR | Status: DC | PRN
  Administered 2018-12-21: 100 mg via INTRAVENOUS

## 2018-12-21 MED ORDER — PROPOFOL 500 MG/50ML IV EMUL
INTRAVENOUS | Status: DC | PRN
  Administered 2018-12-21: 12:00:00 via INTRAVENOUS
  Administered 2018-12-21: 100 ug/kg/min via INTRAVENOUS

## 2018-12-21 MED ORDER — LISINOPRIL 20 MG PO TABS
40.0000 mg | ORAL_TABLET | Freq: Every day | ORAL | Status: DC
  Administered 2018-12-22: 40 mg via ORAL
  Filled 2018-12-21: qty 2

## 2018-12-21 MED ORDER — QUETIAPINE FUMARATE 25 MG PO TABS
50.0000 mg | ORAL_TABLET | Freq: Every day | ORAL | Status: DC
  Administered 2018-12-21: 50 mg via ORAL
  Filled 2018-12-21: qty 2

## 2018-12-21 MED ORDER — CITALOPRAM HYDROBROMIDE 20 MG PO TABS
40.0000 mg | ORAL_TABLET | Freq: Every day | ORAL | Status: DC
  Administered 2018-12-22: 40 mg via ORAL
  Filled 2018-12-21: qty 2

## 2018-12-21 MED ORDER — METHOCARBAMOL 500 MG PO TABS
500.0000 mg | ORAL_TABLET | Freq: Four times a day (QID) | ORAL | Status: DC | PRN
  Administered 2018-12-21 – 2018-12-22 (×2): 500 mg via ORAL
  Filled 2018-12-21 (×3): qty 1

## 2018-12-21 MED ORDER — METHOCARBAMOL 750 MG PO TABS: 750.0000 mg | ORAL_TABLET | Freq: Two times a day (BID) | ORAL | 0 refills | Status: AC | PRN

## 2018-12-21 MED ORDER — ACETAMINOPHEN 325 MG PO TABS: 325.0000 mg | ORAL_TABLET | Freq: Four times a day (QID) | ORAL | Status: DC | PRN

## 2018-12-21 MED ORDER — ACETAMINOPHEN 500 MG PO TABS
1000.0000 mg | ORAL_TABLET | Freq: Four times a day (QID) | ORAL | Status: AC
  Administered 2018-12-21 – 2018-12-22 (×4): 1000 mg via ORAL
  Filled 2018-12-21 (×4): qty 2

## 2018-12-21 MED ORDER — FENTANYL CITRATE (PF) 250 MCG/5ML IJ SOLN
INTRAMUSCULAR | Status: AC
  Filled 2018-12-21: qty 5

## 2018-12-21 MED ORDER — CHLORTHALIDONE 50 MG PO TABS
50.0000 mg | ORAL_TABLET | Freq: Every day | ORAL | Status: DC
  Filled 2018-12-21 (×2): qty 1

## 2018-12-21 MED ORDER — SODIUM CHLORIDE 0.9 % IV SOLN
INTRAVENOUS | Status: DC | PRN
  Administered 2018-12-21: 25 ug/min via INTRAVENOUS

## 2018-12-21 MED ORDER — TRANEXAMIC ACID 1000 MG/10ML IV SOLN
INTRAVENOUS | Status: DC | PRN
  Administered 2018-12-21: 2000 mg via TOPICAL

## 2018-12-21 MED ORDER — DIPHENHYDRAMINE HCL 25 MG PO TABS: 25.0000 mg | ORAL_TABLET | Freq: Every day | ORAL | Status: DC | PRN

## 2018-12-21 MED ORDER — DEXAMETHASONE SODIUM PHOSPHATE 10 MG/ML IJ SOLN
10.0000 mg | Freq: Once | INTRAMUSCULAR | Status: AC
  Administered 2018-12-22: 10 mg via INTRAVENOUS
  Filled 2018-12-21: qty 1

## 2018-12-21 MED ORDER — KETOROLAC TROMETHAMINE 15 MG/ML IJ SOLN
30.0000 mg | Freq: Four times a day (QID) | INTRAMUSCULAR | Status: AC
  Administered 2018-12-21 – 2018-12-22 (×4): 30 mg via INTRAVENOUS
  Filled 2018-12-21 (×4): qty 2

## 2018-12-21 MED ORDER — DULOXETINE HCL 60 MG PO CPEP
60.0000 mg | ORAL_CAPSULE | Freq: Every day | ORAL | Status: DC
  Administered 2018-12-22: 60 mg via ORAL
  Filled 2018-12-21: qty 1

## 2018-12-21 MED ORDER — DIPHENHYDRAMINE HCL 12.5 MG/5ML PO ELIX: 25.0000 mg | ORAL_SOLUTION | ORAL | Status: DC | PRN

## 2018-12-21 MED ORDER — OXYCODONE HCL 5 MG PO TABS
10.0000 mg | ORAL_TABLET | ORAL | Status: DC | PRN
  Administered 2018-12-22: 10 mg via ORAL
  Administered 2018-12-22: 15 mg via ORAL
  Administered 2018-12-22 (×2): 10 mg via ORAL
  Filled 2018-12-21: qty 2
  Filled 2018-12-21: qty 3
  Filled 2018-12-21: qty 2

## 2018-12-21 MED ORDER — CEFAZOLIN SODIUM-DEXTROSE 2-4 GM/100ML-% IV SOLN
2.0000 g | Freq: Four times a day (QID) | INTRAVENOUS | Status: AC
  Administered 2018-12-21 – 2018-12-22 (×3): 2 g via INTRAVENOUS
  Filled 2018-12-21 (×3): qty 100

## 2018-12-21 MED ORDER — VANCOMYCIN HCL 1000 MG IV SOLR
INTRAVENOUS | Status: AC
  Filled 2018-12-21: qty 1000

## 2018-12-21 MED ORDER — FERROUS SULFATE 325 (65 FE) MG PO TABS
325.0000 mg | ORAL_TABLET | Freq: Every day | ORAL | Status: DC
  Administered 2018-12-22: 325 mg via ORAL
  Filled 2018-12-21: qty 1

## 2018-12-21 MED ORDER — MAGNESIUM CITRATE PO SOLN: 1.0000 | Freq: Once | ORAL | Status: DC | PRN

## 2018-12-21 MED ORDER — CEFAZOLIN SODIUM-DEXTROSE 2-4 GM/100ML-% IV SOLN
2.0000 g | INTRAVENOUS | Status: AC
  Administered 2018-12-21: 2 g via INTRAVENOUS

## 2018-12-21 MED ORDER — DOCUSATE SODIUM 100 MG PO CAPS
100.0000 mg | ORAL_CAPSULE | Freq: Two times a day (BID) | ORAL | Status: DC
  Administered 2018-12-21 – 2018-12-22 (×2): 100 mg via ORAL
  Filled 2018-12-21 (×2): qty 1

## 2018-12-21 MED ORDER — SODIUM CHLORIDE 0.9 % IV SOLN
INTRAVENOUS | Status: DC
  Administered 2018-12-21 – 2018-12-22 (×2): via INTRAVENOUS

## 2018-12-21 MED ORDER — HYDROMORPHONE HCL 1 MG/ML IJ SOLN: 0.2500 mg | INTRAMUSCULAR | Status: DC | PRN

## 2018-12-21 MED ORDER — POVIDONE-IODINE 10 % EX SWAB: 2.0000 "application " | Freq: Once | CUTANEOUS | Status: DC

## 2018-12-21 MED ORDER — ONDANSETRON HCL 4 MG PO TABS: 4.0000 mg | ORAL_TABLET | Freq: Three times a day (TID) | ORAL | 0 refills | Status: AC | PRN

## 2018-12-21 MED ORDER — METHOCARBAMOL 1000 MG/10ML IJ SOLN
500.0000 mg | Freq: Four times a day (QID) | INTRAVENOUS | Status: DC | PRN
  Filled 2018-12-21: qty 5

## 2018-12-21 MED ORDER — CEFAZOLIN SODIUM-DEXTROSE 2-4 GM/100ML-% IV SOLN
INTRAVENOUS | Status: AC
  Filled 2018-12-21: qty 100

## 2018-12-21 MED ORDER — TRANEXAMIC ACID-NACL 1000-0.7 MG/100ML-% IV SOLN
1000.0000 mg | INTRAVENOUS | Status: AC
  Administered 2018-12-21: 11:00:00 1000 mg via INTRAVENOUS

## 2018-12-21 MED ORDER — ASPIRIN EC 81 MG PO TBEC: 81.0000 mg | DELAYED_RELEASE_TABLET | Freq: Two times a day (BID) | ORAL | 0 refills | Status: AC

## 2018-12-21 MED ORDER — SULFAMETHOXAZOLE-TRIMETHOPRIM 800-160 MG PO TABS: 1.0000 | ORAL_TABLET | Freq: Two times a day (BID) | ORAL | 0 refills | Status: AC

## 2018-12-21 MED ORDER — TRANEXAMIC ACID-NACL 1000-0.7 MG/100ML-% IV SOLN
1000.0000 mg | Freq: Once | INTRAVENOUS | Status: AC
  Administered 2018-12-21: 17:00:00 1000 mg via INTRAVENOUS
  Filled 2018-12-21: qty 100

## 2018-12-21 MED ORDER — LACTATED RINGERS IV SOLN
INTRAVENOUS | Status: DC
  Administered 2018-12-21: 10:00:00 via INTRAVENOUS

## 2018-12-21 MED ORDER — GABAPENTIN 300 MG PO CAPS: 300.0000 mg | ORAL_CAPSULE | Freq: Three times a day (TID) | ORAL | Status: DC

## 2018-12-21 MED ORDER — OXYCODONE HCL ER 10 MG PO T12A: 10.0000 mg | EXTENDED_RELEASE_TABLET | Freq: Two times a day (BID) | ORAL | 0 refills | Status: AC

## 2018-12-21 MED ORDER — FENTANYL CITRATE (PF) 100 MCG/2ML IJ SOLN: 100.0000 ug | Freq: Once | INTRAMUSCULAR | Status: DC

## 2018-12-21 MED ORDER — SODIUM CHLORIDE 0.9 % IR SOLN
Status: DC | PRN
  Administered 2018-12-21: 3000 mL

## 2018-12-21 MED ORDER — ATORVASTATIN CALCIUM 40 MG PO TABS
40.0000 mg | ORAL_TABLET | Freq: Every day | ORAL | Status: DC
  Administered 2018-12-22: 40 mg via ORAL
  Filled 2018-12-21: qty 1

## 2018-12-21 MED ORDER — BUPIVACAINE IN DEXTROSE 0.75-8.25 % IT SOLN
INTRATHECAL | Status: DC | PRN
  Administered 2018-12-21: 2 mL via INTRATHECAL

## 2018-12-21 MED ORDER — FENTANYL CITRATE (PF) 100 MCG/2ML IJ SOLN
INTRAMUSCULAR | Status: AC
  Administered 2018-12-21: 100 ug
  Filled 2018-12-21: qty 2

## 2018-12-21 MED ORDER — SODIUM CHLORIDE 0.9% FLUSH
INTRAVENOUS | Status: DC | PRN
  Administered 2018-12-21: 20 mL via INTRAVENOUS

## 2018-12-21 MED ORDER — SENNOSIDES-DOCUSATE SODIUM 8.6-50 MG PO TABS: 1.0000 | ORAL_TABLET | Freq: Every evening | ORAL | 1 refills | Status: AC | PRN

## 2018-12-21 MED ORDER — BUPIVACAINE-EPINEPHRINE (PF) 0.25% -1:200000 IJ SOLN
INTRAMUSCULAR | Status: AC
  Filled 2018-12-21: qty 30

## 2018-12-21 MED ORDER — MIDAZOLAM HCL 2 MG/2ML IJ SOLN
INTRAMUSCULAR | Status: AC
  Filled 2018-12-21: qty 2

## 2018-12-21 MED ORDER — ALBUMIN HUMAN 5 % IV SOLN
12.5000 g | Freq: Once | INTRAVENOUS | Status: AC
  Administered 2018-12-21: 12.5 g via INTRAVENOUS

## 2018-12-21 MED ORDER — METOCLOPRAMIDE HCL 5 MG/ML IJ SOLN: 5.0000 mg | Freq: Three times a day (TID) | INTRAMUSCULAR | Status: DC | PRN

## 2018-12-21 MED ORDER — POLYETHYLENE GLYCOL 3350 17 G PO PACK: 17.0000 g | PACK | Freq: Every day | ORAL | Status: DC | PRN

## 2018-12-21 MED ORDER — 0.9 % SODIUM CHLORIDE (POUR BTL) OPTIME
TOPICAL | Status: DC | PRN
  Administered 2018-12-21: 1000 mL

## 2018-12-21 MED ORDER — GABAPENTIN 300 MG PO CAPS
600.0000 mg | ORAL_CAPSULE | Freq: Three times a day (TID) | ORAL | Status: DC
  Administered 2018-12-21 – 2018-12-22 (×3): 600 mg via ORAL
  Filled 2018-12-21 (×3): qty 2

## 2018-12-21 MED ORDER — HYDROMORPHONE HCL 1 MG/ML IJ SOLN
0.5000 mg | INTRAMUSCULAR | Status: DC | PRN
  Administered 2018-12-21 (×2): 1 mg via INTRAVENOUS
  Filled 2018-12-21 (×2): qty 1

## 2018-12-21 MED ORDER — OXYCODONE HCL ER 10 MG PO T12A
10.0000 mg | EXTENDED_RELEASE_TABLET | Freq: Two times a day (BID) | ORAL | Status: DC
  Administered 2018-12-21 – 2018-12-22 (×3): 10 mg via ORAL
  Filled 2018-12-21 (×3): qty 1

## 2018-12-21 MED ORDER — ASPIRIN 81 MG PO CHEW
81.0000 mg | CHEWABLE_TABLET | Freq: Two times a day (BID) | ORAL | Status: DC
  Administered 2018-12-21 – 2018-12-22 (×2): 81 mg via ORAL
  Filled 2018-12-21 (×2): qty 1

## 2018-12-21 MED FILL — ASPIRIN LOW DOSE 81 MG TBEC: 81 | 42 days supply | Qty: 84 | Fill #0

## 2018-12-21 MED FILL — ONDANSETRON HCL 4 MG TABLET: 4 | 6 days supply | Qty: 40 | Fill #0

## 2018-12-21 MED FILL — METHOCARBAMOL 750 MG TABS: 750 | 30 days supply | Qty: 60 | Fill #0

## 2018-12-21 MED FILL — PROMETHAZINE 25 MG TABLET: 25 | 7 days supply | Qty: 30 | Fill #0

## 2018-12-21 MED FILL — SULFAMETHOXAZOLE-TMP DS TAB: 800-160 | 10 days supply | Qty: 20 | Fill #0

## 2018-12-21 MED FILL — STOOL SOFTENER/LAXATIVE 50-: 50-8.6 | 15 days supply | Qty: 30 | Fill #0

## 2018-12-21 SURGICAL SUPPLY — 79 items
ALCOHOL ISOPROPYL (RUBBING) (MISCELLANEOUS) ×2 IMPLANT
BAG DECANTER FOR FLEXI CONT (MISCELLANEOUS) ×2 IMPLANT
BANDAGE ESMARK 6X9 LF (GAUZE/BANDAGES/DRESSINGS) ×1 IMPLANT
BLADE SAW SGTL 13.0X1.19X90.0M (BLADE) ×2 IMPLANT
BNDG ELASTIC 6X10 VLCR STRL LF (GAUZE/BANDAGES/DRESSINGS) ×2 IMPLANT
BNDG ELASTIC 6X15 VLCR STRL LF (GAUZE/BANDAGES/DRESSINGS) ×2 IMPLANT
BNDG ESMARK 6X9 LF (GAUZE/BANDAGES/DRESSINGS) ×2
BOWL SMART MIX CTS (DISPOSABLE) ×2 IMPLANT
CEMENT BONE REFOBACIN R1X40 US (Cement) ×4 IMPLANT
CLSR STERI-STRIP ANTIMIC 1/2X4 (GAUZE/BANDAGES/DRESSINGS) ×4 IMPLANT
COMP FEM CMT PERSONA SZ10 RT (Joint) ×2 IMPLANT
COMPONENT FEM CMT PRNSA SZ10RT (Joint) ×1 IMPLANT
COVER SURGICAL LIGHT HANDLE (MISCELLANEOUS) ×2 IMPLANT
COVER WAND RF STERILE (DRAPES) ×2 IMPLANT
CUFF TOURN SGL QUICK 34 (TOURNIQUET CUFF) ×1
CUFF TOURNIQUET SINGLE 44IN (TOURNIQUET CUFF) IMPLANT
CUFF TRNQT CYL 34X4.125X (TOURNIQUET CUFF) ×1 IMPLANT
DRAPE EXTREMITY T 121X128X90 (DISPOSABLE) ×2 IMPLANT
DRAPE HALF SHEET 40X57 (DRAPES) ×2 IMPLANT
DRAPE INCISE IOBAN 66X45 STRL (DRAPES) IMPLANT
DRAPE ORTHO SPLIT 77X108 STRL (DRAPES) ×2
DRAPE POUCH INSTRU U-SHP 10X18 (DRAPES) ×2 IMPLANT
DRAPE SURG ORHT 6 SPLT 77X108 (DRAPES) ×2 IMPLANT
DRAPE U-SHAPE 47X51 STRL (DRAPES) ×4 IMPLANT
DRILL PIN HEADLESS TROCAR 3X75 (PIN) ×2 IMPLANT
DURAPREP 26ML APPLICATOR (WOUND CARE) ×4 IMPLANT
ELECT CAUTERY BLADE 6.4 (BLADE) ×2 IMPLANT
ELECT REM PT RETURN 9FT ADLT (ELECTROSURGICAL) ×2
ELECTRODE REM PT RTRN 9FT ADLT (ELECTROSURGICAL) ×1 IMPLANT
GAUZE SPONGE 4X4 12PLY STRL (GAUZE/BANDAGES/DRESSINGS) ×2 IMPLANT
GAUZE SPONGE 4X4 12PLY STRL LF (GAUZE/BANDAGES/DRESSINGS) ×2 IMPLANT
GLOVE BIOGEL PI IND STRL 7.0 (GLOVE) ×1 IMPLANT
GLOVE BIOGEL PI IND STRL 7.5 (GLOVE) ×2 IMPLANT
GLOVE BIOGEL PI INDICATOR 7.0 (GLOVE) ×1
GLOVE BIOGEL PI INDICATOR 7.5 (GLOVE) ×2
GLOVE ECLIPSE 7.0 STRL STRAW (GLOVE) ×6 IMPLANT
GLOVE SKINSENSE NS SZ7.5 (GLOVE) ×1
GLOVE SKINSENSE STRL SZ7.5 (GLOVE) ×1 IMPLANT
GLOVE SURG SS PI 6.5 STRL IVOR (GLOVE) ×4 IMPLANT
GLOVE SURG SYN 7.5  E (GLOVE) ×4
GLOVE SURG SYN 7.5 E (GLOVE) ×4 IMPLANT
GOWN STRL REIN XL XLG (GOWN DISPOSABLE) ×2 IMPLANT
GOWN STRL REUS W/ TWL LRG LVL3 (GOWN DISPOSABLE) ×1 IMPLANT
GOWN STRL REUS W/TWL LRG LVL3 (GOWN DISPOSABLE) ×1
HANDPIECE INTERPULSE COAX TIP (DISPOSABLE) ×1
HOOD PEEL AWAY FLYTE STAYCOOL (MISCELLANEOUS) ×6 IMPLANT
INSERT TIBIA KNEE RIGHT 10 (Joint) ×2 IMPLANT
KIT BASIN OR (CUSTOM PROCEDURE TRAY) ×2 IMPLANT
KIT TURNOVER KIT B (KITS) ×2 IMPLANT
MANIFOLD NEPTUNE II (INSTRUMENTS) ×2 IMPLANT
MARKER SKIN DUAL TIP RULER LAB (MISCELLANEOUS) ×2 IMPLANT
NEEDLE SPNL 18GX3.5 QUINCKE PK (NEEDLE) ×4 IMPLANT
NS IRRIG 1000ML POUR BTL (IV SOLUTION) ×2 IMPLANT
PACK TOTAL JOINT (CUSTOM PROCEDURE TRAY) ×2 IMPLANT
PAD ABD 8X10 STRL (GAUZE/BANDAGES/DRESSINGS) ×4 IMPLANT
PAD ARMBOARD 7.5X6 YLW CONV (MISCELLANEOUS) ×4 IMPLANT
PADDING CAST COTTON 6X4 STRL (CAST SUPPLIES) ×2 IMPLANT
SAW OSC TIP CART 19.5X105X1.3 (SAW) ×2 IMPLANT
SET HNDPC FAN SPRY TIP SCT (DISPOSABLE) ×1 IMPLANT
STAPLER VISISTAT 35W (STAPLE) IMPLANT
STEM POLY PAT PLY 35M KNEE (Knees) ×2 IMPLANT
STEM TIBIA 5 DEG SZ F R KNEE (Knees) ×1 IMPLANT
SUCTION FRAZIER HANDLE 10FR (MISCELLANEOUS) ×1
SUCTION FRAZIER TIP 10 FR DISP (SUCTIONS) ×2 IMPLANT
SUCTION TUBE FRAZIER 10FR DISP (MISCELLANEOUS) ×1 IMPLANT
SUT ETHILON 2 0 FS 18 (SUTURE) IMPLANT
SUT MNCRL AB 4-0 PS2 18 (SUTURE) ×2 IMPLANT
SUT VIC AB 0 CT1 27 (SUTURE) ×2
SUT VIC AB 0 CT1 27XBRD ANBCTR (SUTURE) ×2 IMPLANT
SUT VIC AB 1 CTX 27 (SUTURE) ×6 IMPLANT
SUT VIC AB 2-0 CT1 27 (SUTURE) ×4
SUT VIC AB 2-0 CT1 TAPERPNT 27 (SUTURE) ×4 IMPLANT
SYR 50ML LL SCALE MARK (SYRINGE) ×4 IMPLANT
TIBIA STEM 5 DEG SZ F R KNEE (Knees) ×2 IMPLANT
TOWEL GREEN STERILE (TOWEL DISPOSABLE) ×2 IMPLANT
TOWEL GREEN STERILE FF (TOWEL DISPOSABLE) ×2 IMPLANT
TRAY CATH 16FR W/PLASTIC CATH (SET/KITS/TRAYS/PACK) IMPLANT
UNDERPAD 30X30 (UNDERPADS AND DIAPERS) ×2 IMPLANT
WRAP KNEE MAXI GEL POST OP (GAUZE/BANDAGES/DRESSINGS) ×2 IMPLANT

## 2018-12-21 NOTE — Anesthesia Procedure Notes (Signed)
Spinal  Patient location during procedure: OR Start time: 12/21/2018 10:25 AM End time: 12/21/2018 10:29 AM Staffing Anesthesiologist: Roderic Palau, MD Performed: anesthesiologist  Preanesthetic Checklist Completed: patient identified, surgical consent, pre-op evaluation, timeout performed, IV checked, risks and benefits discussed and monitors and equipment checked Spinal Block Patient position: sitting Prep: DuraPrep Patient monitoring: cardiac monitor, continuous pulse ox and blood pressure Approach: midline Location: L3-4 Injection technique: single-shot Needle Needle type: Pencan  Needle gauge: 24 G Needle length: 9 cm Assessment Sensory level: T8 Additional Notes Functioning IV was confirmed and monitors were applied. Sterile prep and drape, including hand hygiene and sterile gloves were used. The patient was positioned and the spine was prepped. The skin was anesthetized with lidocaine.  Free flow of clear CSF was obtained prior to injecting local anesthetic into the CSF.  The spinal needle aspirated freely following injection.  The needle was carefully withdrawn.  The patient tolerated the procedure well.

## 2018-12-21 NOTE — Anesthesia Postprocedure Evaluation (Signed)
Anesthesia Post Note  Patient: Jesse Munoz  Procedure(s) Performed: RIGHT TOTAL KNEE ARTHROPLASTY (Right Knee)     Patient location during evaluation: PACU Anesthesia Type: Spinal and Regional Level of consciousness: oriented and awake and alert Pain management: pain level controlled Vital Signs Assessment: post-procedure vital signs reviewed and stable Respiratory status: spontaneous breathing, respiratory function stable and patient connected to nasal cannula oxygen Cardiovascular status: blood pressure returned to baseline and stable Postop Assessment: no headache, no backache, no apparent nausea or vomiting, spinal receding and patient able to bend at knees Anesthetic complications: no    Last Vitals:  Vitals:   12/21/18 1405 12/21/18 1420  BP: 129/85 129/85  Pulse: (!) 50 (!) 50  Resp: 12 13  Temp:  (!) 36.3 C  SpO2: 100% 100%    Last Pain:  Vitals:   12/21/18 1405  TempSrc:   PainSc: 0-No pain                 Aniaya Bacha,W. EDMOND

## 2018-12-21 NOTE — Progress Notes (Signed)
After anesthesia  Block,pt's blood pressure  87/51, 95/66, 97/69, 95/65. Pt. Arousable. Put in trendelenberg. Notified Dr. Therisa Doyne. Stated to give LR 1000 ml, run it wide open. Continue to monitor. 100/68.

## 2018-12-21 NOTE — Discharge Instructions (Signed)

## 2018-12-21 NOTE — Plan of Care (Signed)
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
## 2018-12-21 NOTE — Op Note (Signed)
Total Knee Arthroplasty Procedure Note  Preoperative diagnosis: Right knee osteoarthritis  Postoperative diagnosis:same  Operative procedure: Right total knee arthroplasty. CPT [phone]  Surgeon: N. Eduard Roux, MD  Assist: Madalyn Rob, PA-C; necessary for the timely completion of procedure and due to complexity of procedure.  Anesthesia: Spinal, regional  Tourniquet time: see anesthesia record  Implants used: Zimmer persona Femur: CR 10 Tibia: F Patella: 35 mm Polyethylene: 10 mm, MC  Indication: Jesse Munoz is a 43 y.o. year old male with a history of knee pain. Having failed conservative management, the patient elected to proceed with a total knee arthroplasty.  We have reviewed the risk and benefits of the surgery and they elected to proceed after voicing understanding.  Procedure:  After informed consent was obtained and understanding of the risk were voiced including but not limited to bleeding, infection, damage to surrounding structures including nerves and vessels, blood clots, leg length inequality and the failure to achieve desired results, the operative extremity was marked with verbal confirmation of the patient in the holding area.   The patient was then brought to the operating room and transported to the operating room table in the supine position.  A tourniquet was applied to the operative extremity around the upper thigh. The operative limb was then prepped and draped in the usual sterile fashion and preoperative antibiotics were administered.  A time out was performed prior to the start of surgery confirming the correct extremity, preoperative antibiotic administration, as well as team members, implants and instruments available for the case. Correct surgical site was also confirmed with preoperative radiographs. The limb was then elevated for exsanguination and the tourniquet was inflated. A midline incision was made and a standard medial parapatellar  approach was performed.  The patella was prepared and sized to a 35 mm.  A cover was placed on the patella for protection from retractors.  We then turned our attention to the femur. Posterior cruciate ligament was sacrificed. Start site was drilled in the femur and the intramedullary distal femoral cutting guide was placed, set at 5 degrees valgus, taking 12 mm of distal resection. The distal cut was made. Osteophytes were then removed.  Extension gap was then checked. Next, the proximal tibial cutting guide was placed with appropriate slope, varus/valgus alignment and depth of resection. The proximal tibial cut was made. Gap blocks were then used to assess the extension gap and alignment, and appropriate soft tissue releases were performed.  Osteophytes were removed. Attention was turned back to the femur, which was sized using the sizing guide to a size 10. Appropriate rotation of the femoral component was determined using epicondylar axis, Whiteside's line, and assessing the flexion gap under ligament tension. The appropriate size 4-in-1 cutting block was placed and cuts were made. Posterior femoral osteophytes and uncapped bone were then removed with the curved osteotome.  Trial components were placed, and stability was checked in full extension, mid-flexion, and deep flexion. Proper tibial rotation was determined and marked.  The patella tracked well without a lateral release. Trial components were then removed and tibial preparation performed.  The tibia was sized for a size F component.  A posterior capsular injection comprising of 20 cc of 1.3% exparel, 20 cc of 0.25% bupivicaine with epi and 20 cc of normal saline was performed for postoperative pain control. The bony surfaces were irrigated with a pulse lavage and then dried. Bone cement was vacuum mixed on the back table, and the final components sized  above were cemented into place. After cement had finished curing, excess cement was removed. The  stability of the construct was re-evaluated throughout a range of motion and found to be acceptable. The trial liner was removed, the knee was copiously irrigated, and the knee was re-evaluated for any excess bone debris. The real polyethylene liner, 10 mm thick, was inserted and checked to ensure the locking mechanism had engaged appropriately. The tourniquet was deflated and hemostasis was achieved. The wound was irrigated with normal saline.  One gram of vancomycin powder was placed in the surgical bed.  Capsular closure was performed with a #1 vicryl, subcutaneous fat closed with a 0 vicryl suture, then subcutaneous tissue closed with interrupted 2.0 vicryl suture. The skin was then closed with a 4.0 monocryl. A sterile dressing was applied.  The patient was awakened in the operating room and taken to recovery in stable condition. All sponge, needle, and instrument counts were correct at the end of the case.  Position: supine  Complications: none.  Time Out: performed   Drains/Packing: none  Estimated blood loss: minimal  Returned to Recovery Room: in good condition.   Antibiotics: yes   Mechanical VTE (DVT) Prophylaxis: sequential compression devices, TED thigh-high  Chemical VTE (DVT) Prophylaxis: aspirin  Fluid Replacement  Crystalloid: see anesthesia record Blood: none  FFP: none   Specimens Removed: 1 to pathology   Sponge and Instrument Count Correct? yes   PACU: portable radiograph - knee AP and Lateral   Plan/RTC: Return in 2 weeks for wound check.   Weight Bearing/Load Lower Extremity: full   N. Eduard Roux, MD [place] [phone] 12:21 PM

## 2018-12-21 NOTE — Transfer of Care (Signed)
Immediate Anesthesia Transfer of Care Note  Patient: ANTION ANDRES  Procedure(s) Performed: RIGHT TOTAL KNEE ARTHROPLASTY (Right Knee)  Patient Location: PACU  Anesthesia Type:MAC and Spinal  Level of Consciousness: drowsy  Airway & Oxygen Therapy: Patient Spontanous Breathing and Patient connected to face mask oxygen  Post-op Assessment: Report given to RN and Post -op Vital signs reviewed and stable  Post vital signs: Reviewed and stable  Last Vitals:  Vitals Value Taken Time  BP 85/53 12/21/18 1306  Temp    Pulse 54 12/21/18 1307  Resp 9 12/21/18 1307  SpO2 96 % 12/21/18 1307  Vitals shown include unvalidated device data.  Last Pain:  Vitals:   12/21/18 1010  TempSrc:   PainSc: 4       Patients Stated Pain Goal: 2 (23/95/32 0233)  Complications: No apparent anesthesia complications

## 2018-12-21 NOTE — Progress Notes (Signed)
PT Cancellation Note  Patient Details Name: Jesse Munoz MRN: [id number] DOB: Oct 28, 1975   Cancelled Treatment:    Reason Eval/Treat Not Completed: Other (comment) .  PT attempted again and I am unable to wake him from sleeping.  This is our second try this evening.  PT will try again in the AM.   Thanks,  Wells Guiles B. Amsi Grimley, PT, DPT  Acute Rehabilitation [phone] pager [phone] office  @ [facility]: [phone]    Jesse Munoz 12/21/2018, 5:25 PM

## 2018-12-21 NOTE — Progress Notes (Signed)
PT Cancellation Note  Patient Details Name: SKY BORBOA MRN: [id number] DOB: 10/08/1975   Cancelled Treatment:    Reason Eval/Treat Not Completed: Pain limiting ability to participate.  Pt is in pain and asking for alternative meds from the RN/MD.   PT to check back later today as time allows. I did take him out of the CPM and reposition his leg with ice packs.  Thanks,  Barbarann Ehlers. Amie Cowens, PT, DPT  Acute Rehabilitation [phone] pager [phone] office  @ [facility]: [phone]     Harvie Heck 12/21/2018, 4:13 PM

## 2018-12-21 NOTE — Anesthesia Procedure Notes (Addendum)
Anesthesia Regional Block: Adductor canal block   Pre-Anesthetic Checklist: ,, timeout performed, Correct Patient, Correct Site, Correct Laterality, Correct Procedure, Correct Position, site marked, Risks and benefits discussed, pre-op evaluation,  At surgeon's request and post-op pain management  Laterality: Right  Prep: Maximum Sterile Barrier Precautions used, chloraprep       Needles:  Injection technique: Single-shot  Needle Type: Echogenic Stimulator Needle     Needle Length: 9cm  Needle Gauge: 21     Additional Needles:   Procedures:,,,, ultrasound used (permanent image in chart),,,,  Narrative:  Start time: 12/21/2018 8:55 AM End time: 12/21/2018 9:05 AM Injection made incrementally with aspirations every 5 mL.  Performed by: Personally  Anesthesiologist: Roderic Palau, MD  Additional Notes: 2% Lidocaine skin wheel.

## 2018-12-22 ENCOUNTER — Telehealth: Payer: Self-pay

## 2018-12-22 ENCOUNTER — Inpatient Hospital Stay: Payer: Medicaid Other | Admitting: Orthopaedic Surgery

## 2018-12-22 ENCOUNTER — Encounter (HOSPITAL_COMMUNITY): Payer: Self-pay | Admitting: Orthopaedic Surgery

## 2018-12-22 DIAGNOSIS — M1711 Unilateral primary osteoarthritis, right knee: Secondary | ICD-10-CM | POA: Diagnosis not present

## 2018-12-22 LAB — BASIC METABOLIC PANEL
Anion gap: 8 (ref 5–15)
BUN: 20 mg/dL (ref 6–20)
CO2: 23 mmol/L (ref 22–32)
Calcium: 8.8 mg/dL — ABNORMAL LOW (ref 8.9–10.3)
Chloride: 106 mmol/L (ref 98–111)
Creatinine, Ser: 1.46 mg/dL — ABNORMAL HIGH (ref 0.61–1.24)
GFR calc Af Amer: >60 mL/min (ref >60)
GFR calc non Af Amer: 57 mL/min — ABNORMAL LOW (ref >60)
Glucose, Bld: 103 mg/dL — ABNORMAL HIGH (ref 70–99)
Potassium: 4.2 mmol/L (ref 3.5–5.1)
Sodium: 137 mmol/L (ref 135–145)

## 2018-12-22 LAB — CBC
HCT: 37.2 % — ABNORMAL LOW (ref 39.0–52.0)
Hemoglobin: 12.2 g/dL — ABNORMAL LOW (ref 13.0–17.0)
MCH: 26.5 pg (ref 26.0–34.0)
MCHC: 32.8 g/dL (ref 30.0–36.0)
MCV: 80.7 fL (ref 80.0–100.0)
Platelets: 151 10*3/uL (ref 150–400)
RBC: 4.61 MIL/uL (ref 4.22–5.81)
RDW: 15.2 % (ref 11.5–15.5)
WBC: 11.9 10*3/uL — ABNORMAL HIGH (ref 4.0–10.5)
nRBC: 0 % (ref 0.0–0.2)

## 2018-12-22 MED FILL — oxyCODONE HCL 5 MG TABS: 5 | 3 days supply | Qty: 30 | Fill #0

## 2018-12-22 MED FILL — oxyCODONE HCL ER 10 MG T12A: 10 | 3 days supply | Qty: 6 | Fill #0

## 2018-12-22 NOTE — TOC Transition Note (Signed)
Transition of Care [facility]) - CM/SW Discharge Note   Patient Details  Name: FRANSISCO MESSMER MRN: [id number] Date of Birth: [birth year]  [facility] CM/SW Contact:  Ninfa Meeker, RN Phone Number: [phone] (working remotely) 12/22/2018, 11:52 AM   Clinical Narrative:   43 yr old gentleman s/p right total knee arthroplasty. Case manager spoke with patient via telephone concerning discharge plan and DME. Patient was preoperatively setup with [facility], no changes. DMe will be delivered to his room. Patient says he will have family support at discharge.      Final next level of care: Home w Home Health Services Barriers to Discharge: No Barriers Identified   Patient Goals and CMS Choice Patient states their goals for this hospitalization and ongoing recovery are:: to get better   Choice offered to / list presented to : NA(patient was preoperatively setup with Home Health agency)  Discharge Placement                       Discharge Plan and Services   Discharge Planning Services: CM Consult Post Acute Care Choice: Durable Medical Equipment, Home Health          DME Arranged: 3-N-1, Walker rolling DME Agency: [employer] Date DME Agency Contacted: 12/22/18 Time DME Agency Contacted: 1224 Representative spoke with at DME Agency: [place]: PT [place]: [facility] (now [facility]) Date [place]: 12/22/18(preoperatively contacted) Time HH Agency Contacted: 1150(confirmed) Representative spoke with at [place]: [place] ([place]) Interventions     Readmission Risk Interventions No flowsheet data found.
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
## 2018-12-22 NOTE — Progress Notes (Signed)
Orthopedic Tech Progress Note Patient Details:  Jesse Munoz February 13, 1976 [id number]  CPM Right Knee CPM Right Knee: Off     [name] 12/22/2018, 9:09 AM

## 2018-12-22 NOTE — Progress Notes (Signed)
Subjective: 1 Day Post-Op Procedure(s) (LRB): RIGHT TOTAL KNEE ARTHROPLASTY (Right) Patient reports pain as mild.  Feeling well this am.   Objective: Vital signs in last 24 hours: Temp:  [97 F (36.1 C)-98.7 F (37.1 C)] 97.5 F (36.4 C) (07/07 0333) Pulse Rate:  [50-69] 63 (07/07 0333) Resp:  [8-17] 14 (07/07 0333) BP: (85-134)/(51-94) 101/58 (07/07 0333) SpO2:  [97 %-100 %] 99 % (07/07 0333) Weight:  [93.9 kg] 93.9 kg (07/06 0810)  Intake/Output from previous day: 07/06 0701 - 07/07 0700 In: 3152.6 [P.O.:360; I.V.:2592.6; IV Piggyback:200] Out: 275 [Urine:225; Blood:50] Intake/Output this shift: No intake/output data recorded.  Recent Labs    12/21/18 0810 12/22/18 0352  HGB 14.7 12.2*   Recent Labs    12/21/18 0810 12/22/18 0352  WBC 11.1* 11.9*  RBC 5.60 4.61  HCT 44.9 37.2*  PLT 196 151   Recent Labs    12/21/18 0810 12/22/18 0352  NA 137 137  K 4.4 4.2  CL 98 106  CO2 26 23  BUN 13 20  CREATININE 1.15 1.46*  GLUCOSE 100* 103*  CALCIUM 10.0 8.8*   Recent Labs    12/21/18 0810  INR 1.0    Neurologically intact Neurovascular intact Sensation intact distally Intact pulses distally Dorsiflexion/Plantar flexion intact Incision: dressing C/D/I No cellulitis present Compartment soft   Assessment/Plan: 1 Day Post-Op Procedure(s) (LRB): RIGHT TOTAL KNEE ARTHROPLASTY (Right) Advance diet Up with therapy D/C IV fluids Discharge home with home health today as long as he mobilizes well with PT WBAT RLE Please apply ted hose  Bandage changed by me today   Anticipated LOS equal to or greater than 2 midnights due to - Age 70 and older with one or more of the following:  - Obesity  - Expected need for hospital services (PT, OT, Nursing) required for safe  discharge  - Anticipated need for postoperative skilled nursing care or inpatient rehab  - Active co-morbidities: Chronic pain requiring opiods, Heart Attack and Advanced Liver Disease OR    - Unanticipated findings during/Post Surgery: Slow post-op progression: GI, pain control, mobility  - Patient is a high risk of re-admission due to: None    Aundra Dubin 12/22/2018, 8:00 AM

## 2018-12-22 NOTE — Telephone Encounter (Signed)
[place] tracks portal prior auth for oxycodone 5mg  tablets approved confirmation #[id number]

## 2018-12-22 NOTE — Plan of Care (Signed)
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
## 2018-12-23 ENCOUNTER — Telehealth: Payer: Self-pay | Admitting: Orthopaedic Surgery

## 2018-12-23 ENCOUNTER — Other Ambulatory Visit: Payer: Self-pay | Admitting: Physician Assistant

## 2018-12-23 MED ORDER — HYDROMORPHONE HCL 2 MG PO TABS: ORAL_TABLET | ORAL | 0 refills | Status: AC

## 2018-12-23 MED FILL — METOPROLOL SUCCINATE ER 25: 25 | 30 days supply | Qty: 15 | Fill #1

## 2018-12-23 MED FILL — CHLORTHALIDONE 50 MG TABLET: 50 | 30 days supply | Qty: 30 | Fill #1

## 2018-12-23 MED FILL — DULOXETINE HCL 60 MG CPEP: 60 | 30 days supply | Qty: 30 | Fill #1

## 2018-12-23 MED FILL — CITALOPRAM HBR 40 MG TABLET: 40 | 30 days supply | Qty: 30 | Fill #1

## 2018-12-23 MED FILL — LISINOPRIL 40 MG TABLET: 40 | 30 days supply | Qty: 30 | Fill #0

## 2018-12-23 MED FILL — GABAPENTIN 300 MG CAPSULE: 300 | 30 days supply | Qty: 180 | Fill #1

## 2018-12-23 MED FILL — HYDROmorphone HCL 2 MG TABS: 2 | 5 days supply | Qty: 10 | Fill #0

## 2018-12-23 MED FILL — PROMETHAZINE 25 MG TABLET: 25 | 30 days supply | Qty: 30 | Fill #1

## 2018-12-23 MED FILL — QUETIAPINE FUMARATE 50 MG T: 50 | 30 days supply | Qty: 30 | Fill #1

## 2018-12-23 NOTE — Telephone Encounter (Signed)
This has been addressed see previous message.  

## 2018-12-23 NOTE — Telephone Encounter (Signed)
I called and sw pt's wife to advise of message below. Voiced understanding and will call with any questions. She did have a question about a pain management and I advised the pt just had a joint replacement on Monday and that the pain that he is having is understandable and that hopefully, the adjustment in medication will give some pain relief.  Will call with any other questions.

## 2018-12-23 NOTE — Telephone Encounter (Signed)
Pt called in said he would like for dr.xu to please give Jesse Munoz the pt wife a call, she has some questions and concerns to go over with you.   [phone]

## 2018-12-23 NOTE — Telephone Encounter (Signed)
Was thinking this may happen.  What do you think?

## 2018-12-23 NOTE — Telephone Encounter (Signed)
He should take 800 mg advil TID to supplement.  We can send in #10 of dilaudid one time.  2 mg TID prn pain.  Thanks.

## 2018-12-23 NOTE — Telephone Encounter (Signed)
Add 800mg  advil tid in addition to pain meds.  I have also called in 2mg  dilaudid to supplement. Please use sparingly as we cannot write oxy 10 and the dilaudid will not be refilled

## 2018-12-23 NOTE — Telephone Encounter (Signed)
Pt is s/p a total knee replacement 12/21/2018 he was given rx for oxycodone 5 mg # 30 and oxycontin 10 mg #6 please see message below and advise.

## 2018-12-23 NOTE — Telephone Encounter (Signed)
Message to lindsey about the pt's medication request. S/p a total knee 12/21/18. Will hold message pending advisement.

## 2018-12-23 NOTE — Telephone Encounter (Signed)
Patient called advised he is still in a lot of pain and the medication is not helping with the pain. Patient asked if he can at least get 10mg  of Oxycodone?  Patient said he can not do therapy today because he is in so much pain.The number to contact patient is [phone]

## 2018-12-28 ENCOUNTER — Telehealth: Payer: Self-pay | Admitting: Orthopaedic Surgery

## 2018-12-28 NOTE — Telephone Encounter (Signed)
Jesse Munoz with [facility] called in requesting verbal orders for home health pt for  1 time a week for 1 week, 2 times a week for 1 week and 1 time a week for 1 week.    [phone]

## 2018-12-28 NOTE — Telephone Encounter (Signed)
ok 

## 2018-12-28 NOTE — Telephone Encounter (Signed)
IC verbal given.  FYI

## 2018-12-29 ENCOUNTER — Other Ambulatory Visit: Payer: Self-pay

## 2018-12-29 ENCOUNTER — Encounter: Payer: Self-pay | Admitting: Orthopaedic Surgery

## 2018-12-29 ENCOUNTER — Telehealth: Payer: Self-pay

## 2018-12-29 ENCOUNTER — Ambulatory Visit (INDEPENDENT_AMBULATORY_CARE_PROVIDER_SITE_OTHER): Payer: Medicaid Other | Admitting: Orthopaedic Surgery

## 2018-12-29 ENCOUNTER — Ambulatory Visit (INDEPENDENT_AMBULATORY_CARE_PROVIDER_SITE_OTHER): Payer: Medicaid Other

## 2018-12-29 DIAGNOSIS — Z96651 Presence of right artificial knee joint: Secondary | ICD-10-CM

## 2018-12-29 MED ORDER — CEPHALEXIN 500 MG PO CAPS: ORAL_CAPSULE | ORAL | 0 refills | Status: AC

## 2018-12-29 MED ORDER — OXYCODONE HCL 5 MG PO TABS: 10.0000 mg | ORAL_TABLET | ORAL | 0 refills | Status: AC | PRN

## 2018-12-29 MED FILL — CEPHALEXIN 500 MG CAPSULE: 500 | 10 days supply | Qty: 40 | Fill #0

## 2018-12-29 MED FILL — oxyCODONE HCL 5 MG TABS: 5 | 3 days supply | Qty: 30 | Fill #0

## 2018-12-29 NOTE — Telephone Encounter (Signed)
[name] with [facility] called stating that patient had a fall last night while going up the stairs and injured his right knee that patient just had surgery on.  Stated that patient's right knee is actively bleeding.  Talked with Dr. Erlinda Hong and advised him of the message above and he stated for patient to come into the office.   Jesse Munoz that Dr. Erlinda Hong wanted to see patient in the office today. Per patient, he could not be here until after 4:15 due to not having a ride until after 4pm.  Talked with Dr. Erlinda Hong and advised him of the time for the patient and he stated for the [facility] nurse to send a picture of patient's right knee and to change patient's dressing.  Received a call from [facility] and she stated that patient can come into the office at 2pm today and that she still send the picture of patient's right knee.
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
## 2018-12-29 NOTE — Progress Notes (Signed)
Per Dr Erlinda Hong rx for Keflex sent to pharmacy. Patient will try to make it to appt today.

## 2018-12-30 ENCOUNTER — Telehealth: Payer: Self-pay | Admitting: Orthopaedic Surgery

## 2018-12-30 NOTE — Telephone Encounter (Signed)
Patient voicemail message from patient  advised the Oxycodone 10mg  is not helping with the pain he is having with his knee replacement.The number to contact patient is [phone]

## 2018-12-30 NOTE — Telephone Encounter (Signed)
See below

## 2018-12-30 NOTE — Telephone Encounter (Signed)
He should go to the ER to get IV pain meds then.

## 2018-12-31 ENCOUNTER — Ambulatory Visit: Payer: Medicaid Other | Admitting: Orthopaedic Surgery

## 2018-12-31 NOTE — Telephone Encounter (Signed)
Patient aware of the below message  

## 2019-01-01 ENCOUNTER — Ambulatory Visit: Payer: Medicaid Other | Attending: Nurse Practitioner | Admitting: Nurse Practitioner

## 2019-01-01 ENCOUNTER — Other Ambulatory Visit: Payer: Self-pay

## 2019-01-01 ENCOUNTER — Encounter: Payer: Self-pay | Admitting: Nurse Practitioner

## 2019-01-01 DIAGNOSIS — M1711 Unilateral primary osteoarthritis, right knee: Secondary | ICD-10-CM | POA: Diagnosis not present

## 2019-01-01 DIAGNOSIS — Z8379 Family history of other diseases of the digestive system: Secondary | ICD-10-CM | POA: Diagnosis not present

## 2019-01-01 DIAGNOSIS — F17210 Nicotine dependence, cigarettes, uncomplicated: Secondary | ICD-10-CM | POA: Insufficient documentation

## 2019-01-01 DIAGNOSIS — Z96653 Presence of artificial knee joint, bilateral: Secondary | ICD-10-CM | POA: Insufficient documentation
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
## 2019-01-06 ENCOUNTER — Other Ambulatory Visit: Payer: Self-pay

## 2019-01-06 ENCOUNTER — Ambulatory Visit (INDEPENDENT_AMBULATORY_CARE_PROVIDER_SITE_OTHER): Payer: Medicaid Other | Admitting: Physician Assistant

## 2019-01-06 ENCOUNTER — Ambulatory Visit (INDEPENDENT_AMBULATORY_CARE_PROVIDER_SITE_OTHER): Payer: Medicaid Other

## 2019-01-06 ENCOUNTER — Encounter: Payer: Self-pay | Admitting: Orthopaedic Surgery

## 2019-01-06 DIAGNOSIS — Z96651 Presence of right artificial knee joint: Secondary | ICD-10-CM | POA: Diagnosis not present

## 2019-01-06 MED ORDER — IBUPROFEN 800 MG PO TABS: 800.0000 mg | ORAL_TABLET | Freq: Three times a day (TID) | ORAL | 0 refills | Status: AC | PRN

## 2019-01-06 MED ORDER — OXYCODONE-ACETAMINOPHEN 10-325 MG PO TABS: 1.0000 | ORAL_TABLET | ORAL | 0 refills | Status: DC | PRN

## 2019-01-06 MED FILL — OXYCODONE-APAP 10-325: 10-325 | 5 days supply | Qty: 30 | Fill #0

## 2019-01-06 MED FILL — IBUPROFEN 800 MG TAB: 800 | 10 days supply | Qty: 30 | Fill #0
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
## 2019-01-07 MED FILL — BIKTARVY 50-200-25 MG TABS: 50-200-25 | 30 days supply | Qty: 30 | Fill #1

## 2019-01-11 ENCOUNTER — Other Ambulatory Visit: Payer: Self-pay | Admitting: Physician Assistant

## 2019-01-11 ENCOUNTER — Telehealth: Payer: Self-pay | Admitting: Orthopaedic Surgery

## 2019-01-11 MED ORDER — OXYCODONE-ACETAMINOPHEN 10-325 MG PO TABS: 1.0000 | ORAL_TABLET | Freq: Three times a day (TID) | ORAL | 0 refills | Status: DC | PRN

## 2019-01-11 MED FILL — OXYCODONE-APAP 10-325: 10-325 | 7 days supply | Qty: 20 | Fill #0

## 2019-01-11 NOTE — Telephone Encounter (Signed)
Called into [place].  Will need to use sparingly as we need to wean off of this

## 2019-01-11 NOTE — Telephone Encounter (Signed)
Please advise 

## 2019-01-11 NOTE — Telephone Encounter (Signed)
I called patient and advised. 

## 2019-01-11 NOTE — Telephone Encounter (Signed)
Patient called to request Percocet refill.  Please call patient @ [phone]

## 2019-01-15 ENCOUNTER — Telehealth: Payer: Self-pay

## 2019-01-15 ENCOUNTER — Other Ambulatory Visit: Payer: Self-pay | Admitting: Physician Assistant

## 2019-01-15 MED ORDER — DOXYCYCLINE HYCLATE 50 MG PO CAPS: 50.0000 mg | ORAL_CAPSULE | Freq: Two times a day (BID) | ORAL | 0 refills | Status: AC

## 2019-01-15 MED FILL — DOXYCYCLINE HYC 50 MG CAP: 50 | 7 days supply | Qty: 14 | Fill #0

## 2019-01-15 NOTE — Telephone Encounter (Signed)
Patient called concerning clear drainage from his right knee incision.  Stated that his right knee had been draining for a couple of days.  Talked with Mendel Ryder, Dr. Phoebe Sharps PA and she stated for patient to come into the office today. Advised patient that he needed to be seen today per Mendel Ryder, but stated that he did not have transportation. Talked with Mendel Ryder and she stated that she would send in an antibiotic to his pharmacy and if he has any issues over the weekend, such as fever or chills, for patient to call the office. Stated that patient needed to schedule an appointment for next week to be seen.  Advised patient of Lindsey's message above and patient voiced that he understands.  Patient did ask for a Rx refill on Percocet, but per [place] Rx to early to refill.  Patient advised of this as well.  Patient has an appointment Wednesday, 01/20/2019 with Dr. Erlinda Hong.

## 2019-01-18 ENCOUNTER — Telehealth: Payer: Self-pay

## 2019-01-18 ENCOUNTER — Other Ambulatory Visit: Payer: Self-pay | Admitting: Physician Assistant

## 2019-01-18 ENCOUNTER — Telehealth: Payer: Self-pay | Admitting: Orthopaedic Surgery

## 2019-01-18 MED ORDER — OXYCODONE-ACETAMINOPHEN 10-325 MG PO TABS: ORAL_TABLET | ORAL | 0 refills | Status: AC

## 2019-01-18 MED FILL — OXYCODONE-APAP 10-325: 10-325 | 10 days supply | Qty: 20 | Fill #0

## 2019-01-18 NOTE — Telephone Encounter (Signed)
Patient would like a Rx refill on Oxycodone 10-325 sent to [facility].  Cb# is [phone].  Please advise.  Thank you.

## 2019-01-18 NOTE — Telephone Encounter (Signed)
Patient notified

## 2019-01-18 NOTE — Telephone Encounter (Signed)
Please advise 

## 2019-01-18 NOTE — Telephone Encounter (Signed)
See previous message

## 2019-01-18 NOTE — Telephone Encounter (Signed)
Thank you.  Patient advised by Secundino Ginger.

## 2019-01-18 NOTE — Telephone Encounter (Signed)
Pt called in requesting refill on medication oxycodone.  [phone]

## 2019-01-18 NOTE — Telephone Encounter (Signed)
Sent in.  Needs to start weaning off of this

## 2019-01-20 ENCOUNTER — Ambulatory Visit: Payer: Medicaid Other | Admitting: Orthopaedic Surgery

## 2019-01-26 ENCOUNTER — Other Ambulatory Visit: Payer: Self-pay | Admitting: Physician Assistant

## 2019-01-26 ENCOUNTER — Other Ambulatory Visit: Payer: Self-pay | Admitting: Orthopaedic Surgery

## 2019-01-26 ENCOUNTER — Telehealth: Payer: Self-pay | Admitting: Orthopaedic Surgery

## 2019-01-26 ENCOUNTER — Other Ambulatory Visit: Payer: Self-pay | Admitting: Nurse Practitioner

## 2019-01-26 DIAGNOSIS — M1711 Unilateral primary osteoarthritis, right knee: Secondary | ICD-10-CM

## 2019-01-26 MED ORDER — OXYCODONE-ACETAMINOPHEN 5-325 MG PO TABS: 1.0000 | ORAL_TABLET | Freq: Every day | ORAL | 0 refills | Status: AC | PRN

## 2019-01-26 MED FILL — GABAPENTIN 300 MG CAPSULE: 300 | 30 days supply | Qty: 180 | Fill #2

## 2019-01-26 MED FILL — TRIAMCINOLONE ACETONIDE 0.1: 0.1 | 20 days supply | Qty: 60 | Fill #5

## 2019-01-26 MED FILL — OXYCODONE-ACETAMINOPHEN 5-3: 5-325 | 15 days supply | Qty: 30 | Fill #0

## 2019-01-26 MED FILL — QUETIAPINE FUMARATE 50 MG T: 50 | 30 days supply | Qty: 30 | Fill #2

## 2019-01-26 MED FILL — LISINOPRIL 40 MG TABLET: 40 | 30 days supply | Qty: 30 | Fill #1

## 2019-01-26 MED FILL — PROMETHAZINE 25 MG TABLET: 25 | 7 days supply | Qty: 30 | Fill #1

## 2019-01-26 MED FILL — CITALOPRAM HBR 40 MG TABLET: 40 | 30 days supply | Qty: 30 | Fill #2

## 2019-01-26 MED FILL — CHLORTHALIDONE 50 MG TABLET: 50 | 30 days supply | Qty: 30 | Fill #2

## 2019-01-26 NOTE — Telephone Encounter (Signed)
Patient called requesting an RX refill on his Oxycodone, but at the lower dose to start the weaning process.  Patient would like a call back when the medication is called into the pharmacy.  CB#[phone].  Thank you.

## 2019-01-26 NOTE — Telephone Encounter (Signed)
Please advise. Thanks.  

## 2019-01-26 NOTE — Telephone Encounter (Signed)
Ok to rf? 

## 2019-01-27 ENCOUNTER — Other Ambulatory Visit: Payer: Self-pay | Admitting: Orthopaedic Surgery

## 2019-01-27 MED FILL — DULOXETINE HCL 60 MG CPEP: 60 | 30 days supply | Qty: 30 | Fill #0

## 2019-01-27 NOTE — Telephone Encounter (Signed)
Doesn't need it anymore

## 2019-01-27 NOTE — Telephone Encounter (Signed)
Ok to rf? 

## 2019-01-29 ENCOUNTER — Ambulatory Visit: Payer: Medicaid Other | Admitting: Orthopaedic Surgery

## 2019-02-02 ENCOUNTER — Telehealth: Payer: Self-pay

## 2019-02-02 ENCOUNTER — Other Ambulatory Visit: Payer: Self-pay | Admitting: Orthopaedic Surgery

## 2019-02-02 ENCOUNTER — Emergency Department (HOSPITAL_COMMUNITY)
Admission: EM | Admit: 2019-02-02 | Discharge: 2019-02-02 | Disposition: A | Discharge status: Home / Self Care | Payer: Medicaid Other | Attending: Emergency Medicine | Admitting: Emergency Medicine

## 2019-02-02 ENCOUNTER — Other Ambulatory Visit: Payer: Self-pay | Admitting: Family

## 2019-02-02 ENCOUNTER — Other Ambulatory Visit: Payer: Self-pay

## 2019-02-02 ENCOUNTER — Telehealth: Payer: Self-pay | Admitting: Orthopaedic Surgery

## 2019-02-02 ENCOUNTER — Emergency Department (HOSPITAL_COMMUNITY): Payer: Medicaid Other

## 2019-02-02 ENCOUNTER — Encounter (HOSPITAL_COMMUNITY): Payer: Self-pay | Admitting: *Deleted

## 2019-02-02 DIAGNOSIS — Z20828 Contact with and (suspected) exposure to other viral communicable diseases: Secondary | ICD-10-CM | POA: Diagnosis not present

## 2019-02-02 DIAGNOSIS — I10 Essential (primary) hypertension: Secondary | ICD-10-CM | POA: Insufficient documentation

## 2019-02-02 DIAGNOSIS — F17210 Nicotine dependence, cigarettes, uncomplicated: Secondary | ICD-10-CM | POA: Insufficient documentation

## 2019-02-02 DIAGNOSIS — B20 Human immunodeficiency virus [HIV] disease: Secondary | ICD-10-CM

## 2019-02-02 DIAGNOSIS — M791 Myalgia, unspecified site: Secondary | ICD-10-CM | POA: Diagnosis present

## 2019-02-02 DIAGNOSIS — Z21 Asymptomatic human immunodeficiency virus [HIV] infection status: Secondary | ICD-10-CM | POA: Insufficient documentation

## 2019-02-02 DIAGNOSIS — Z79899 Other long term (current) drug therapy: Secondary | ICD-10-CM | POA: Diagnosis not present

## 2019-02-02 DIAGNOSIS — Z7982 Long term (current) use of aspirin: Secondary | ICD-10-CM | POA: Insufficient documentation

## 2019-02-02 DIAGNOSIS — Z96653 Presence of artificial knee joint, bilateral: Secondary | ICD-10-CM | POA: Diagnosis not present

## 2019-02-02 DIAGNOSIS — B9789 Other viral agents as the cause of diseases classified elsewhere: Secondary | ICD-10-CM

## 2019-02-02 DIAGNOSIS — J069 Acute upper respiratory infection, unspecified: Secondary | ICD-10-CM | POA: Insufficient documentation

## 2019-02-02 MED ORDER — BENZONATATE 100 MG PO CAPS: 100.0000 mg | ORAL_CAPSULE | Freq: Three times a day (TID) | ORAL | 0 refills | Status: AC

## 2019-02-02 MED ORDER — CEPHALEXIN 500 MG PO CAPS: 500.0000 mg | ORAL_CAPSULE | Freq: Four times a day (QID) | ORAL | 0 refills | Status: AC

## 2019-02-02 MED ORDER — OXYCODONE-ACETAMINOPHEN 5-325 MG PO TABS
1.0000 | ORAL_TABLET | Freq: Once | ORAL | Status: AC
  Administered 2019-02-02: 1 via ORAL
  Filled 2019-02-02: qty 1

## 2019-02-02 MED ORDER — HYDROCODONE-ACETAMINOPHEN 7.5-325 MG PO TABS: 1.0000 | ORAL_TABLET | Freq: Two times a day (BID) | ORAL | 0 refills | Status: DC | PRN

## 2019-02-02 MED FILL — CEPHALEXIN 500 MG CAPSULE: 500 | 10 days supply | Qty: 40 | Fill #0

## 2019-02-02 MED FILL — BENZONATATE 100 MG CAPS: 100 | 7 days supply | Qty: 21 | Fill #0

## 2019-02-02 MED FILL — HYDROCODON-APAP 7.5-325: 7.5-325 | 7 days supply | Qty: 30 | Fill #0

## 2019-02-02 NOTE — ED Notes (Signed)
Patient verbalized understanding of discharge instructions and denies any further needs or questions at this time. VS stable. Patient ambulatory with steady gait.  
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
## 2019-02-02 NOTE — ED Triage Notes (Signed)
Since yesterday, pt has been having generalized bodyaches, fever, congestion, and productive cough. Had negative covid test 3 weeks ago for knee surgery

## 2019-02-02 NOTE — Telephone Encounter (Signed)
Patient advised that Rx's were sent to his pharmacy.

## 2019-02-02 NOTE — Telephone Encounter (Signed)
We are weaning him off of the 10 mg oxy.  He can have refill of 7.5 mg hydrocodone at this time.  1-2 tabs bid prn #30

## 2019-02-02 NOTE — Telephone Encounter (Signed)
Do you want to refill? 

## 2019-02-02 NOTE — Telephone Encounter (Signed)
Please advise on message below. Thank you!

## 2019-02-02 NOTE — Telephone Encounter (Signed)
Pt called in requesting a refill on medication oxycodone.   [phone]

## 2019-02-02 NOTE — Telephone Encounter (Signed)
Rx refill

## 2019-02-02 NOTE — Telephone Encounter (Signed)
Patient called again concerning Rx refill and stated that his right leg and foot are swollen and that his right Knee has some clear drainage, but when he wipes it on something, it looks light yellow.  Stated no odor, but has had a fever and was seen at the ED today. COVID testing done today and patient stated that it will take 2 days for results.

## 2019-02-02 NOTE — Telephone Encounter (Signed)
Advised patient of message below.   Patient has appointment on Friday, 02/05/2019.  Patient would like an antibiotic called into his pharmacy and Rx refill for pain.

## 2019-02-02 NOTE — Telephone Encounter (Signed)
Called patient to schedule appointment to follow up with Terri Piedra, FNP. Patient last saw Janene Madeira, Np on 06/02/18 in office;; however patient did not schedule follow up appointment in 6 months with FNP.  Was able to reach patient to schedule follow up appointment with FNP. Patient would like to have labs done same day. Is currently scheduled to see [name] on 9/8.  Aundria Rud, [place]'

## 2019-02-02 NOTE — Discharge Instructions (Signed)
Please read attached information. If you experience any new or worsening signs or symptoms please return to the emergency room for evaluation. Please follow-up with your primary care provider or specialist as discussed. Please use medication prescribed only as directed and discontinue taking if you have any concerning signs or symptoms.   °

## 2019-02-02 NOTE — ED Notes (Signed)
EDP at bedside  

## 2019-02-02 NOTE — Telephone Encounter (Signed)
[place].  He needs to come in for evaluation.

## 2019-02-02 NOTE — Telephone Encounter (Signed)
Please advise patient called and would like a RX refill of pain medication Oxycodone/Acet 10-325 mg. He also states that he has a fever and his knee is swollen. He did mention getting a COVID-19 testing and is awaiting results.

## 2019-02-02 NOTE — Addendum Note (Signed)
Addended by: Azucena Cecil on: 02/02/2019 04:07 PM   Modules accepted: Orders

## 2019-02-03 ENCOUNTER — Other Ambulatory Visit: Payer: Self-pay | Admitting: Family

## 2019-02-03 ENCOUNTER — Other Ambulatory Visit: Payer: Self-pay

## 2019-02-03 LAB — NOVEL CORONAVIRUS, NAA (HOSP ORDER, SEND-OUT TO REF LAB; TAT 18-24 HRS): SARS-CoV-2, NAA: NOT DETECTED

## 2019-02-03 MED ORDER — HYDROCODONE-ACETAMINOPHEN 7.5-325 MG PO TABS: 1.0000 | ORAL_TABLET | Freq: Two times a day (BID) | ORAL | 0 refills | Status: DC | PRN

## 2019-02-03 MED FILL — BIKTARVY 50-200-25 MG TABS: 50-200-25 | 30 days supply | Qty: 30 | Fill #0

## 2019-02-03 NOTE — Telephone Encounter (Signed)
No refill

## 2019-02-05 ENCOUNTER — Ambulatory Visit: Payer: Medicaid Other | Admitting: Orthopaedic Surgery

## 2019-02-10 ENCOUNTER — Telehealth: Payer: Self-pay | Admitting: Orthopaedic Surgery

## 2019-02-10 MED FILL — HYDROCODON-APAP 7.5-325: 7.5-325 | 7 days supply | Qty: 30 | Fill #0

## 2019-02-10 NOTE — Telephone Encounter (Signed)
When was last refill?

## 2019-02-10 NOTE — Telephone Encounter (Signed)
Requesting rf

## 2019-02-10 NOTE — Telephone Encounter (Signed)
Rx refill Hydrocodone °

## 2019-02-10 NOTE — Telephone Encounter (Signed)
He has no showed a couple of appts.  No meds until he comes to see [place].

## 2019-02-10 NOTE — Telephone Encounter (Signed)
08/19 #30 

## 2019-02-10 NOTE — Telephone Encounter (Signed)
Ok to rf? 

## 2019-02-10 NOTE — Telephone Encounter (Signed)
Tried calling. Male who answered phone stated he was not at home but would have him call us back so we could advise per Dr Erlinda Hong.

## 2019-02-15 ENCOUNTER — Ambulatory Visit: Payer: Medicaid Other | Admitting: Nurse Practitioner

## 2019-02-16 ENCOUNTER — Other Ambulatory Visit: Payer: Self-pay | Admitting: Orthopaedic Surgery

## 2019-02-16 ENCOUNTER — Ambulatory Visit (INDEPENDENT_AMBULATORY_CARE_PROVIDER_SITE_OTHER): Payer: Medicaid Other | Admitting: Physician Assistant

## 2019-02-16 ENCOUNTER — Ambulatory Visit (INDEPENDENT_AMBULATORY_CARE_PROVIDER_SITE_OTHER): Payer: Medicaid Other

## 2019-02-16 ENCOUNTER — Encounter: Payer: Self-pay | Admitting: Orthopaedic Surgery

## 2019-02-16 ENCOUNTER — Other Ambulatory Visit: Payer: Self-pay | Admitting: Nurse Practitioner

## 2019-02-16 DIAGNOSIS — Z96651 Presence of right artificial knee joint: Secondary | ICD-10-CM | POA: Diagnosis not present

## 2019-02-16 DIAGNOSIS — F419 Anxiety disorder, unspecified: Secondary | ICD-10-CM

## 2019-02-16 DIAGNOSIS — M1711 Unilateral primary osteoarthritis, right knee: Secondary | ICD-10-CM

## 2019-02-16 MED ORDER — AMOXICILLIN 500 MG PO CAPS: ORAL_CAPSULE | ORAL | 1 refills | Status: AC

## 2019-02-16 MED ORDER — HYDROCODONE-ACETAMINOPHEN 5-325 MG PO TABS: 1.0000 | ORAL_TABLET | Freq: Every day | ORAL | 0 refills | Status: AC | PRN

## 2019-02-16 MED FILL — HYDROCODON-APAP 5-325: 5-325 | 7 days supply | Qty: 14 | Fill #0

## 2019-02-16 MED FILL — AMOXICILLIN 500 MG CAPSULE: 500 | 3 days supply | Qty: 12 | Fill #0

## 2019-02-16 NOTE — Addendum Note (Signed)
Addended byLaurann [place] on: 02/16/2019 10:35 AM   Modules accepted: Orders
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
## 2019-02-16 NOTE — Telephone Encounter (Signed)
Please advise 

## 2019-02-23 ENCOUNTER — Ambulatory Visit: Payer: Medicaid Other | Admitting: Family

## 2019-02-23 ENCOUNTER — Other Ambulatory Visit: Payer: Self-pay | Admitting: Orthopaedic Surgery

## 2019-02-23 ENCOUNTER — Telehealth: Payer: Self-pay | Admitting: Nurse Practitioner

## 2019-02-23 MED FILL — HYDROCODON-APAP 5-325: 5-325 | 3 days supply | Qty: 6 | Fill #1

## 2019-02-23 MED FILL — DULOXETINE HCL 60 MG CPEP: 60 | 30 days supply | Qty: 30 | Fill #1

## 2019-02-23 MED FILL — AMOXICILLIN 500 MG CAPSULE: 500 | 3 days supply | Qty: 12 | Fill #1

## 2019-02-23 MED FILL — QUETIAPINE FUMARATE 50 MG T: 50 | 30 days supply | Qty: 30 | Fill #3

## 2019-02-23 MED FILL — GABAPENTIN 300 MG CAPSULE: 300 | 30 days supply | Qty: 180 | Fill #0

## 2019-02-23 MED FILL — PROMETHAZINE 25 MG TABLET: 25 | 8 days supply | Qty: 30 | Fill #0

## 2019-02-23 MED FILL — CHLORTHALIDONE 50 MG TABLET: 50 | 30 days supply | Qty: 30 | Fill #3

## 2019-02-23 MED FILL — LISINOPRIL 40 MG TABLET: 40 | 30 days supply | Qty: 30 | Fill #2

## 2019-02-23 MED FILL — CITALOPRAM HBR 40 MG TABLET: 40 | 30 days supply | Qty: 30 | Fill #0

## 2019-02-23 NOTE — Telephone Encounter (Signed)
yes

## 2019-02-23 NOTE — Telephone Encounter (Signed)
Ok to rf? 

## 2019-02-23 NOTE — Telephone Encounter (Signed)
Patient does not want to disclose whats wrong states it has to do with a medication.please follow up.   Patient was advised to go to the ER or ED

## 2019-02-25 MED FILL — BIKTARVY 50-200-25 MG TABS: 50-200-25 | 30 days supply | Qty: 30 | Fill #1

## 2019-02-25 NOTE — Telephone Encounter (Signed)
CMA attempt to reach patient. No answer and LVM.  

## 2019-03-11 MED FILL — OXYCODONE-ACETAMINOPHEN 5-3: 5-325 | 7 days supply | Qty: 21 | Fill #0

## 2019-03-11 MED FILL — ETODOLAC 200 MG CAPS: 200 | 7 days supply | Qty: 7 | Fill #0

## 2019-03-11 MED FILL — METHOCARBAMOL 500 MG TABS: 500 | 14 days supply | Qty: 28 | Fill #0

## 2019-03-22 MED FILL — PROMETHAZINE 25 MG TABLET: 25 | 8 days supply | Qty: 30 | Fill #1

## 2019-03-22 MED FILL — DULOXETINE HCL 60 MG CPEP: 60 | 30 days supply | Qty: 30 | Fill #2

## 2019-03-22 MED FILL — CITALOPRAM HBR 40 MG TABLET: 40 | 30 days supply | Qty: 30 | Fill #1

## 2019-03-22 MED FILL — GABAPENTIN 300 MG CAPSULE: 300 | 30 days supply | Qty: 180 | Fill #1

## 2019-03-25 ENCOUNTER — Other Ambulatory Visit: Payer: Self-pay | Admitting: Family

## 2019-03-25 DIAGNOSIS — B20 Human immunodeficiency virus [HIV] disease: Secondary | ICD-10-CM

## 2019-03-25 MED FILL — METHOCARBAMOL 500 MG TABS: 500 | 30 days supply | Qty: 60 | Fill #0

## 2019-03-25 MED FILL — OXYCODONE-ACETAMINOPHEN 5-3: 5-325 | 30 days supply | Qty: 90 | Fill #0

## 2019-03-30 ENCOUNTER — Ambulatory Visit (INDEPENDENT_AMBULATORY_CARE_PROVIDER_SITE_OTHER): Payer: Medicaid Other

## 2019-03-30 ENCOUNTER — Other Ambulatory Visit: Payer: Self-pay

## 2019-03-30 ENCOUNTER — Ambulatory Visit (INDEPENDENT_AMBULATORY_CARE_PROVIDER_SITE_OTHER): Payer: Medicaid Other | Admitting: Orthopaedic Surgery

## 2019-03-30 DIAGNOSIS — Z96651 Presence of right artificial knee joint: Secondary | ICD-10-CM | POA: Diagnosis not present
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
## 2019-04-13 MED FILL — OXYCODON-ACETAMINOPHEN 7.5-: 7.5-325 | 11 days supply | Qty: 22 | Fill #0

## 2019-04-13 MED FILL — CELECOXIB 100 MG CAP: 100 | 8 days supply | Qty: 8 | Fill #0

## 2019-04-20 ENCOUNTER — Other Ambulatory Visit: Payer: Self-pay | Admitting: Physician Assistant

## 2019-04-20 MED FILL — GABAPENTIN 300 MG CAPSULE: 300 | 30 days supply | Qty: 180 | Fill #2

## 2019-04-21 ENCOUNTER — Ambulatory Visit: Payer: Medicaid Other | Admitting: Orthopaedic Surgery

## 2019-04-23 MED FILL — DULOXETINE HCL 60 MG CPEP: 60 | 90 days supply | Qty: 90 | Fill #0

## 2019-04-23 MED FILL — LISINOPRIL 40 MG TABLET: 40 | 90 days supply | Qty: 90 | Fill #0

## 2019-04-23 MED FILL — CHLORTHALIDONE 50 MG TABLET: 50 | 30 days supply | Qty: 30 | Fill #0

## 2019-04-23 MED FILL — ATORVASTATIN 40 MG TABLET: 40 | 90 days supply | Qty: 90 | Fill #0

## 2019-04-23 MED FILL — BIKTARVY 50-200-25 MG TABS: 50-200-25 | 90 days supply | Qty: 90 | Fill #0

## 2019-04-23 MED FILL — PROMETHAZINE 25 MG TABLET: 25 | 14 days supply | Qty: 28 | Fill #0

## 2019-04-23 MED FILL — QUETIAPINE FUMARATE 50 MG T: 50 | 30 days supply | Qty: 30 | Fill #0

## 2019-04-23 MED FILL — TRIAMCINOLONE 0.1% OINTMENT: 0.1 | 10 days supply | Qty: 30 | Fill #0

## 2019-04-26 MED FILL — OXYCODON-ACETAMINOPHEN 7.5-: 7.5-325 | 30 days supply | Qty: 90 | Fill #0

## 2019-04-26 MED FILL — METHOCARBAMOL 500 MG TABLET: 500 | 30 days supply | Qty: 60 | Fill #0

## 2019-05-10 ENCOUNTER — Other Ambulatory Visit: Payer: Self-pay | Admitting: Nurse Practitioner

## 2019-05-10 DIAGNOSIS — M1711 Unilateral primary osteoarthritis, right knee: Secondary | ICD-10-CM

## 2019-05-12 MED FILL — GABAPENTIN 300 MG CAPSULE: 300 | 30 days supply | Qty: 180 | Fill #0

## 2019-05-20 ENCOUNTER — Telehealth: Payer: Self-pay | Admitting: Orthopaedic Surgery

## 2019-05-20 NOTE — Telephone Encounter (Signed)
Received vm from Shirlee Limerick w/ [facility] checking request for records. IC,advised we do not have request, need to resend.

## 2019-05-26 MED FILL — PROMETHAZINE 25 MG TABLET: 25 | 14 days supply | Qty: 28 | Fill #0

## 2019-05-26 MED FILL — GABAPENTIN 600 MG TABLET: 600 | 30 days supply | Qty: 90 | Fill #0

## 2019-05-26 MED FILL — OXYCODON-ACETAMINOPHEN 7.5-: 7.5-325 | 30 days supply | Qty: 90 | Fill #0

## 2019-06-24 MED FILL — CITALOPRAM HBR 40 MG TABLET: 40 | 30 days supply | Qty: 30 | Fill #2

## 2019-06-24 MED FILL — GABAPENTIN 300 MG CAPSULE: 300 | 30 days supply | Qty: 180 | Fill #1

## 2019-06-24 MED FILL — CHLORTHALIDONE 50 MG TABLET: 50 | 30 days supply | Qty: 30 | Fill #1

## 2019-06-25 MED FILL — PROMETHAZINE 25 MG TABLET: 25 | 14 days supply | Qty: 28 | Fill #0

## 2019-06-25 MED FILL — OXYCODONE-APAP 10-325: 10-325 | 30 days supply | Qty: 90 | Fill #0

## 2019-06-25 MED FILL — GABAPENTIN 600 MG TABLET: 600 | 30 days supply | Qty: 90 | Fill #0

## 2019-06-30 ENCOUNTER — Ambulatory Visit: Payer: Medicaid Other | Admitting: Orthopaedic Surgery

## 2019-07-15 MED FILL — busPIRone HCL 5 MG TABS: 5 | 30 days supply | Qty: 60 | Fill #0

## 2019-07-15 MED FILL — DULOXETINE HCL 60 MG CPEP: 60 | 90 days supply | Qty: 90 | Fill #0

## 2019-07-15 MED FILL — QUETIAPINE FUMARATE 100 MG: 100 | 30 days supply | Qty: 60 | Fill #0

## 2019-07-15 MED FILL — PRAZOSIN HCL 1 MG CAPS: 1 | 30 days supply | Qty: 30 | Fill #0

## 2019-07-26 MED FILL — GABAPENTIN 600 MG TABLET: 600 | 30 days supply | Qty: 60 | Fill #0

## 2019-07-26 MED FILL — OXYCODONE-APAP 10-325: 10-325 | 30 days supply | Qty: 120 | Fill #0

## 2019-07-26 MED FILL — METOPROLOL SUCCINATE ER 25: 25 | 30 days supply | Qty: 30 | Fill #0

## 2019-08-23 MED FILL — GABAPENTIN 600 MG TABLET: 600 | 30 days supply | Qty: 60 | Fill #0

## 2019-08-23 MED FILL — DOXYCYCLINE HYCLATE 100 MG: 100 | 7 days supply | Qty: 14 | Fill #0

## 2019-08-23 MED FILL — OXYCODONE-APAP 10-325: 10-325 | 30 days supply | Qty: 120 | Fill #0

## 2019-08-23 MED FILL — METOPROLOL SUCCINATE ER 50: 50 | 30 days supply | Qty: 30 | Fill #0

## 2019-09-20 MED FILL — GABAPENTIN 600 MG TABLET: 600 | 30 days supply | Qty: 60 | Fill #0

## 2019-09-22 MED FILL — BIKTARVY 50-200-25 MG TABS: 50-200-25 | 90 days supply | Qty: 90 | Fill #0

## 2019-09-23 MED FILL — OXYCODONE-APAP 10-325: 10-325 | 30 days supply | Qty: 120 | Fill #0

## 2019-09-23 MED FILL — DOXYCYCLINE HYCLATE 100 MG: 100 | 7 days supply | Qty: 14 | Fill #0

## 2019-10-22 MED FILL — OXYCODONE-APAP 10-325: 10-325 | 30 days supply | Qty: 120 | Fill #0

## 2019-10-22 MED FILL — METOPROLOL SUCCINATE ER 50: 50 | 30 days supply | Qty: 30 | Fill #0

## 2019-10-22 MED FILL — GABAPENTIN 600 MG TABLET: 600 | 30 days supply | Qty: 60 | Fill #0

## 2019-11-19 MED FILL — LISINOPRIL 40 MG TABLET: 40 | 90 days supply | Qty: 90 | Fill #0

## 2019-11-19 MED FILL — GABAPENTIN 600 MG TABLET: 600 | 30 days supply | Qty: 60 | Fill #0

## 2019-11-19 MED FILL — OXYCODONE-ACETAMINOPHEN 10-: 10-325 | 30 days supply | Qty: 120 | Fill #0

## 2019-11-19 MED FILL — METOPROLOL SUCCINATE ER 50: 50 | 90 days supply | Qty: 90 | Fill #0

## 2019-11-19 MED FILL — OMEPRAZOLE 40 MG CPDR: 40 | 30 days supply | Qty: 30 | Fill #0

## 2019-11-22 ENCOUNTER — Other Ambulatory Visit: Payer: Medicaid Other

## 2019-12-06 ENCOUNTER — Encounter: Payer: Medicaid Other | Admitting: Family

## 2019-12-06 ENCOUNTER — Telehealth: Payer: Self-pay | Admitting: Family

## 2019-12-06 NOTE — Telephone Encounter (Signed)
Attempted to call patient to inform him of his missed visit. Mobile number is invalid and couldn't be reached at home number. Jesse Munoz

## 2020-09-29 IMAGING — CR CHEST - 2 VIEW
2 series · 2 of 2 positions shown · non-contrast
Comparison: None.

CLINICAL DATA: Pre-admission study prior 2 right knee surgery.

EXAM:
CHEST - 2 VIEW

[w chest pa]
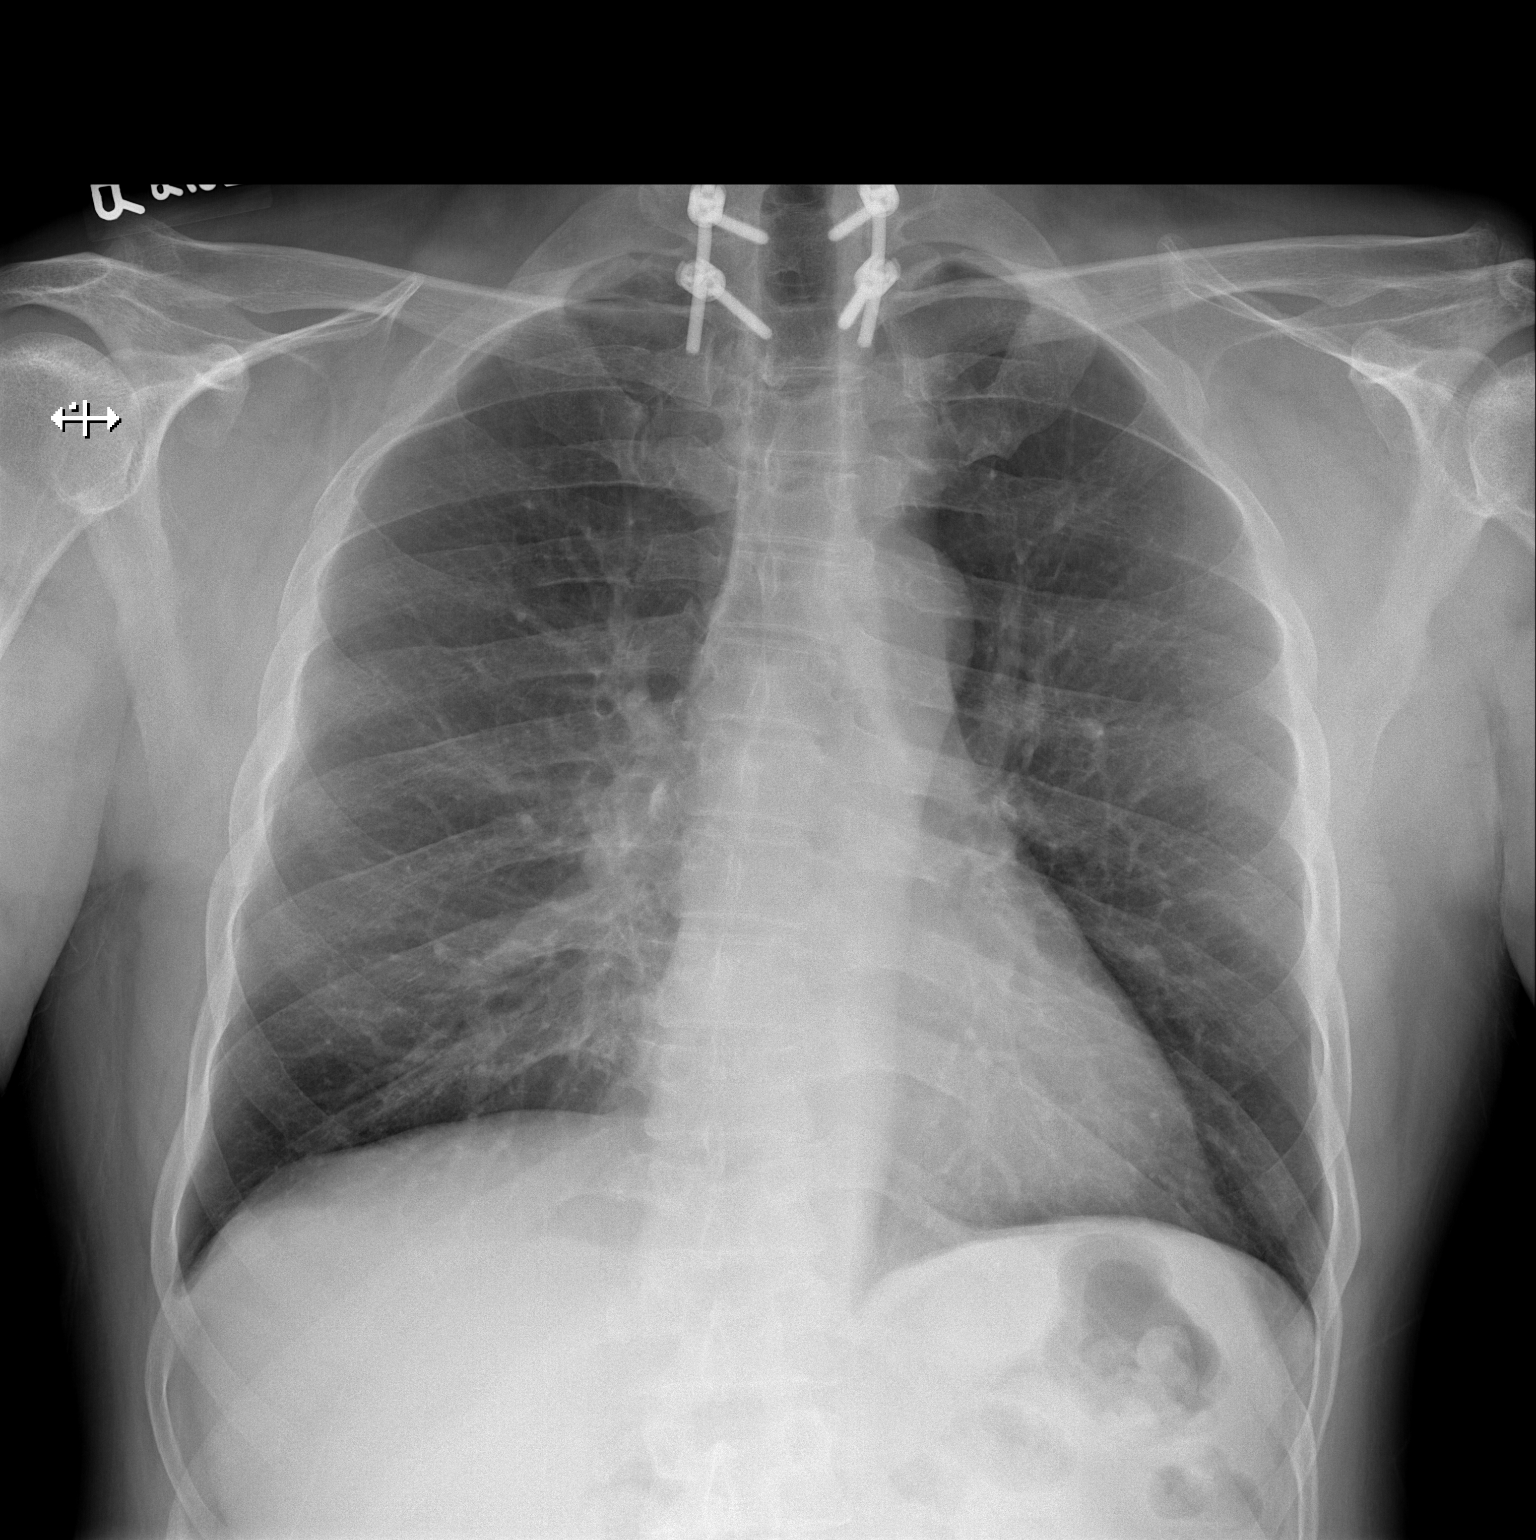

[w chest lat]
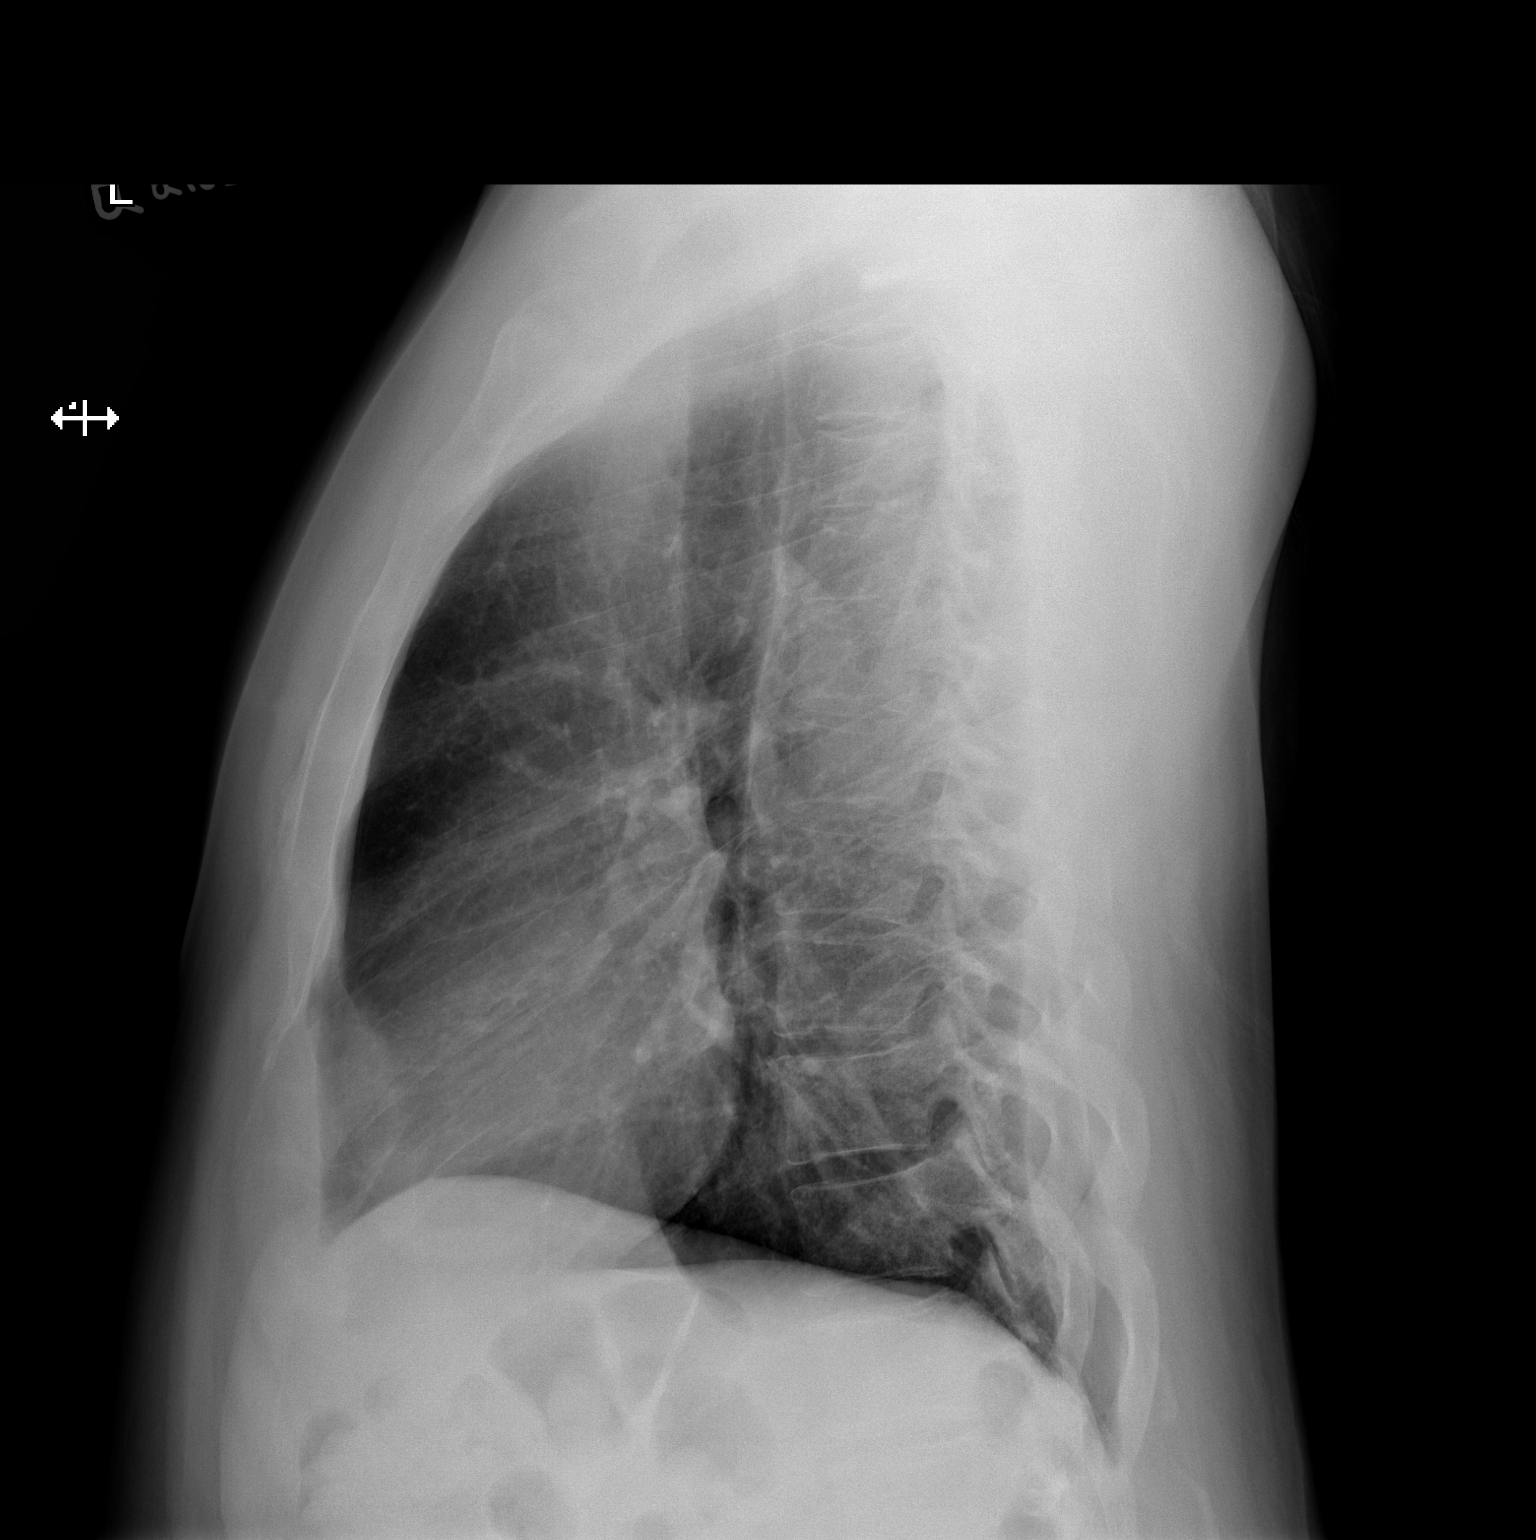

[2 of 2 positions shown; findings below may reference images not displayed]

FINDINGS: The heart size and mediastinal contours are within normal limits.
Both lungs are clear. The visualized skeletal structures are
unremarkable.
IMPRESSION: No active cardiopulmonary disease.

## 2021-01-25 ENCOUNTER — Telehealth: Payer: Self-pay | Admitting: *Deleted

## 2021-01-25 NOTE — Telephone Encounter (Signed)
Per chart review, patient has relocated to [place], [place] notified. Landis Gandy, RN

## 2021-08-02 ENCOUNTER — Telehealth: Payer: Self-pay | Admitting: Orthopaedic Surgery

## 2021-08-02 NOTE — Telephone Encounter (Signed)
Patient called asked what is the name of the hardware that was used for his total knee replacement? The number to contact patient is [phone]

## 2021-08-02 NOTE — Telephone Encounter (Signed)
[facility] persona

## 2021-08-03 NOTE — Telephone Encounter (Signed)
LMOM of the below message for patient  

## 2021-08-06 ENCOUNTER — Telehealth: Payer: Self-pay | Admitting: Orthopaedic Surgery

## 2021-08-06 NOTE — Telephone Encounter (Signed)
Pt called, lmvm. wants to know the hardware used from the knee replacement surgery he had a couple years ago. Callback [phone]

## 2021-08-06 NOTE — Telephone Encounter (Signed)
[facility]

## 2021-08-07 NOTE — Telephone Encounter (Signed)
Lvm for pt to cb to inform 

## 2021-11-15 DEATH — deceased
